# Patient Record
Sex: Male | Born: 1970
Health system: Southern US, Community
[De-identification: ages and names within clinical notes are randomized; demographics above are authoritative.]

## PROBLEM LIST (undated history)

## (undated) DIAGNOSIS — K219 Gastro-esophageal reflux disease without esophagitis: Secondary | ICD-10-CM

## (undated) DIAGNOSIS — T7840XA Allergy, unspecified, initial encounter: Secondary | ICD-10-CM

## (undated) DIAGNOSIS — R011 Cardiac murmur, unspecified: Secondary | ICD-10-CM

## (undated) DIAGNOSIS — N529 Male erectile dysfunction, unspecified: Secondary | ICD-10-CM

## (undated) DIAGNOSIS — I10 Essential (primary) hypertension: Secondary | ICD-10-CM

## (undated) DIAGNOSIS — E785 Hyperlipidemia, unspecified: Secondary | ICD-10-CM

## (undated) DIAGNOSIS — N200 Calculus of kidney: Secondary | ICD-10-CM

## (undated) DIAGNOSIS — F419 Anxiety disorder, unspecified: Secondary | ICD-10-CM

## (undated) HISTORY — DX: Calculus of kidney: N20.0

## (undated) HISTORY — DX: Allergy, unspecified, initial encounter: T78.40XA

## (undated) HISTORY — DX: Hyperlipidemia, unspecified: E78.5

## (undated) HISTORY — DX: Essential (primary) hypertension: I10

## (undated) HISTORY — DX: Anxiety disorder, unspecified: F41.9

## (undated) HISTORY — DX: Gastro-esophageal reflux disease without esophagitis: K21.9

## (undated) HISTORY — DX: Cardiac murmur, unspecified: R01.1

## (undated) HISTORY — DX: Male erectile dysfunction, unspecified: N52.9

## (undated) HISTORY — PX: DENTAL SURGERY: SHX609

---

## 1989-11-07 HISTORY — PX: BUNIONECTOMY: SHX129

## 1997-11-07 DIAGNOSIS — R011 Cardiac murmur, unspecified: Secondary | ICD-10-CM

## 1997-11-07 HISTORY — DX: Cardiac murmur, unspecified: R01.1

## 1998-09-04 ENCOUNTER — Emergency Department (HOSPITAL_COMMUNITY): Admission: EM | Admit: 1998-09-04 | Discharge: 1998-09-04 | Payer: Self-pay | Admitting: Emergency Medicine

## 1998-09-05 ENCOUNTER — Emergency Department (HOSPITAL_COMMUNITY): Admission: EM | Admit: 1998-09-05 | Discharge: 1998-09-05 | Payer: Self-pay | Admitting: Emergency Medicine

## 1998-11-07 DIAGNOSIS — N200 Calculus of kidney: Secondary | ICD-10-CM

## 1998-11-07 HISTORY — DX: Calculus of kidney: N20.0

## 1999-06-24 ENCOUNTER — Encounter: Payer: Self-pay | Admitting: Emergency Medicine

## 1999-06-24 ENCOUNTER — Emergency Department (HOSPITAL_COMMUNITY): Admission: EM | Admit: 1999-06-24 | Discharge: 1999-06-24 | Payer: Self-pay | Admitting: Emergency Medicine

## 2001-02-22 ENCOUNTER — Emergency Department (HOSPITAL_COMMUNITY): Admission: EM | Admit: 2001-02-22 | Discharge: 2001-02-22 | Payer: Self-pay | Admitting: Emergency Medicine

## 2001-06-29 ENCOUNTER — Encounter: Payer: Self-pay | Admitting: Emergency Medicine

## 2001-06-29 ENCOUNTER — Emergency Department (HOSPITAL_COMMUNITY): Admission: EM | Admit: 2001-06-29 | Discharge: 2001-06-29 | Payer: Self-pay | Admitting: Emergency Medicine

## 2004-01-29 ENCOUNTER — Emergency Department (HOSPITAL_COMMUNITY): Admission: EM | Admit: 2004-01-29 | Discharge: 2004-01-29 | Payer: Self-pay | Admitting: Emergency Medicine

## 2004-04-08 ENCOUNTER — Encounter: Payer: Self-pay | Admitting: Internal Medicine

## 2004-04-20 ENCOUNTER — Encounter: Payer: Self-pay | Admitting: Internal Medicine

## 2004-07-05 ENCOUNTER — Encounter: Admission: RE | Admit: 2004-07-05 | Discharge: 2004-07-05 | Payer: Self-pay | Admitting: Internal Medicine

## 2005-03-08 ENCOUNTER — Emergency Department (HOSPITAL_COMMUNITY): Admission: EM | Admit: 2005-03-08 | Discharge: 2005-03-09 | Payer: Self-pay | Admitting: Emergency Medicine

## 2005-03-11 ENCOUNTER — Encounter: Admission: RE | Admit: 2005-03-11 | Discharge: 2005-03-11 | Payer: Self-pay | Admitting: Internal Medicine

## 2005-03-11 ENCOUNTER — Ambulatory Visit: Payer: Self-pay | Admitting: Internal Medicine

## 2005-03-28 ENCOUNTER — Ambulatory Visit: Payer: Self-pay | Admitting: Internal Medicine

## 2005-06-29 ENCOUNTER — Ambulatory Visit: Payer: Self-pay | Admitting: Internal Medicine

## 2006-04-20 ENCOUNTER — Emergency Department (HOSPITAL_COMMUNITY): Admission: EM | Admit: 2006-04-20 | Discharge: 2006-04-21 | Payer: Self-pay | Admitting: Emergency Medicine

## 2006-06-27 ENCOUNTER — Encounter: Admission: RE | Admit: 2006-06-27 | Discharge: 2006-06-27 | Payer: Self-pay | Admitting: Occupational Medicine

## 2007-06-14 ENCOUNTER — Emergency Department (HOSPITAL_COMMUNITY): Admission: EM | Admit: 2007-06-14 | Discharge: 2007-06-14 | Payer: Self-pay | Admitting: *Deleted

## 2007-12-04 ENCOUNTER — Encounter (INDEPENDENT_AMBULATORY_CARE_PROVIDER_SITE_OTHER): Payer: Self-pay | Admitting: *Deleted

## 2007-12-04 ENCOUNTER — Telehealth (INDEPENDENT_AMBULATORY_CARE_PROVIDER_SITE_OTHER): Payer: Self-pay | Admitting: *Deleted

## 2007-12-04 ENCOUNTER — Ambulatory Visit: Payer: Self-pay | Admitting: Internal Medicine

## 2007-12-04 DIAGNOSIS — K219 Gastro-esophageal reflux disease without esophagitis: Secondary | ICD-10-CM | POA: Insufficient documentation

## 2007-12-04 DIAGNOSIS — Z8679 Personal history of other diseases of the circulatory system: Secondary | ICD-10-CM | POA: Insufficient documentation

## 2007-12-04 DIAGNOSIS — Z87442 Personal history of urinary calculi: Secondary | ICD-10-CM | POA: Insufficient documentation

## 2007-12-04 DIAGNOSIS — Z9189 Other specified personal risk factors, not elsewhere classified: Secondary | ICD-10-CM | POA: Insufficient documentation

## 2008-05-16 ENCOUNTER — Ambulatory Visit: Payer: Self-pay | Admitting: Internal Medicine

## 2008-05-16 DIAGNOSIS — I10 Essential (primary) hypertension: Secondary | ICD-10-CM | POA: Insufficient documentation

## 2008-05-16 DIAGNOSIS — F528 Other sexual dysfunction not due to a substance or known physiological condition: Secondary | ICD-10-CM | POA: Insufficient documentation

## 2008-05-22 ENCOUNTER — Ambulatory Visit: Payer: Self-pay | Admitting: Internal Medicine

## 2008-05-26 LAB — CONVERTED CEMR LAB
ALT: 30 units/L (ref 0–53)
AST: 33 units/L (ref 0–37)
Albumin: 4.2 g/dL (ref 3.5–5.2)
BUN: 12 mg/dL (ref 6–23)
Bilirubin, Direct: 0.1 mg/dL (ref 0.0–0.3)
Calcium: 9.4 mg/dL (ref 8.4–10.5)
Chloride: 105 meq/L (ref 96–112)
Cholesterol: 148 mg/dL (ref 0–200)
Creatinine, Ser: 1.1 mg/dL (ref 0.4–1.5)
GFR calc Af Amer: 96 mL/min
HCT: 42.5 % (ref 39.0–52.0)
Lymphocytes Relative: 35.3 % (ref 12.0–46.0)
MCV: 94.5 fL (ref 78.0–100.0)
Neutro Abs: 2.4 10*3/uL (ref 1.4–7.7)
Platelets: 290 10*3/uL (ref 150–400)
Potassium: 4.6 meq/L (ref 3.5–5.1)
RBC: 4.5 M/uL (ref 4.22–5.81)
Sodium: 140 meq/L (ref 135–145)
Total CHOL/HDL Ratio: 3.3
Total Protein: 7.3 g/dL (ref 6.0–8.3)
Triglycerides: 58 mg/dL (ref 0–149)
VLDL: 12 mg/dL (ref 0–40)
WBC: 4.5 10*3/uL (ref 4.5–10.5)

## 2008-06-11 ENCOUNTER — Telehealth (INDEPENDENT_AMBULATORY_CARE_PROVIDER_SITE_OTHER): Payer: Self-pay | Admitting: *Deleted

## 2008-06-11 ENCOUNTER — Encounter: Payer: Self-pay | Admitting: Internal Medicine

## 2008-06-12 ENCOUNTER — Telehealth (INDEPENDENT_AMBULATORY_CARE_PROVIDER_SITE_OTHER): Payer: Self-pay | Admitting: *Deleted

## 2008-08-06 ENCOUNTER — Ambulatory Visit: Payer: Self-pay | Admitting: Internal Medicine

## 2008-12-04 ENCOUNTER — Ambulatory Visit: Payer: Self-pay | Admitting: Internal Medicine

## 2008-12-04 DIAGNOSIS — R519 Headache, unspecified: Secondary | ICD-10-CM | POA: Insufficient documentation

## 2008-12-04 DIAGNOSIS — R51 Headache: Secondary | ICD-10-CM | POA: Insufficient documentation

## 2008-12-04 DIAGNOSIS — G4700 Insomnia, unspecified: Secondary | ICD-10-CM | POA: Insufficient documentation

## 2008-12-05 ENCOUNTER — Telehealth: Payer: Self-pay | Admitting: Internal Medicine

## 2008-12-18 ENCOUNTER — Telehealth: Payer: Self-pay | Admitting: Internal Medicine

## 2010-02-08 ENCOUNTER — Ambulatory Visit: Payer: Self-pay | Admitting: Internal Medicine

## 2010-02-11 LAB — CONVERTED CEMR LAB
ALT: 28 units/L (ref 0–53)
BUN: 8 mg/dL (ref 6–23)
Basophils Absolute: 0 10*3/uL (ref 0.0–0.1)
Basophils Relative: 1.1 % (ref 0.0–3.0)
Calcium: 9.1 mg/dL (ref 8.4–10.5)
Creatinine, Ser: 0.9 mg/dL (ref 0.4–1.5)
GFR calc non Af Amer: 119.76 mL/min (ref >60)
HCT: 42.9 % (ref 39.0–52.0)
HDL: 54.4 mg/dL (ref >39.00)
Hemoglobin: 15 g/dL (ref 13.0–17.0)
LDL Cholesterol: 95 mg/dL (ref 0–99)
Lymphs Abs: 2.2 10*3/uL (ref 0.7–4.0)
MCHC: 35 g/dL (ref 30.0–36.0)
Monocytes Absolute: 0.6 10*3/uL (ref 0.1–1.0)
Neutrophils Relative %: 20.2 % — ABNORMAL LOW (ref 43.0–77.0)
Potassium: 4.3 meq/L (ref 3.5–5.1)
RBC: 4.5 M/uL (ref 4.22–5.81)
RDW: 13.5 % (ref 11.5–14.6)
Sodium: 141 meq/L (ref 135–145)
VLDL: 15.4 mg/dL (ref 0.0–40.0)
WBC: 4 10*3/uL — ABNORMAL LOW (ref 4.5–10.5)

## 2010-03-21 ENCOUNTER — Emergency Department (HOSPITAL_COMMUNITY): Admission: EM | Admit: 2010-03-21 | Discharge: 2010-03-21 | Payer: Self-pay | Admitting: Emergency Medicine

## 2010-07-02 ENCOUNTER — Ambulatory Visit: Payer: Self-pay | Admitting: Internal Medicine

## 2010-07-02 DIAGNOSIS — M549 Dorsalgia, unspecified: Secondary | ICD-10-CM | POA: Insufficient documentation

## 2010-08-10 ENCOUNTER — Ambulatory Visit: Payer: Self-pay | Admitting: Internal Medicine

## 2010-08-10 DIAGNOSIS — F17200 Nicotine dependence, unspecified, uncomplicated: Secondary | ICD-10-CM | POA: Insufficient documentation

## 2010-08-16 ENCOUNTER — Encounter: Payer: Self-pay | Admitting: Internal Medicine

## 2010-08-17 ENCOUNTER — Encounter: Payer: Self-pay | Admitting: Internal Medicine

## 2010-12-07 NOTE — Assessment & Plan Note (Signed)
Summary: FOLLOWUP, TALK ABOUT QUIT SMOKING///SPH   Vital Signs:  Patient profile:   40 year old male Weight:      201.38 pounds Pulse rate:   94 / minute Pulse rhythm:   regular BP sitting:   130 / 76  (left arm) Cuff size:   regular  Vitals Entered By: Army Fossa CMA (August 10, 2010 10:21 AM) CC: Pt here to f/u on BP and back pain, discuss quit smoking.  Comments Flexeril not helping Rx for chantix?  [facility] [address]   History of Present Illness:  Pt here to f/u on BP BPs are checked at [facility] and by the nurse at work, good readings  continue with back pain forabout 2 months now, pain is in the mid lower back, no radiation, lasts few seconds when she get up from the sitting position. Intensity is as high as 4/10, he is feeling more like a stiffness than actual pain  Flexeril not helping  discuss quit smoking.  smoking since he was a teenager, no recent attempts to quit but he has decreased the what  he smokes on and off. Currently smoking a little more than a pack a day. wife also smokes but she's tobacco free for one month Thinks that his stress at work making more prone to smoke Prescribe  Chantix?  ROS Denies abdominal pain No dysuria or gross hematuria    Current Medications (verified): 1)  Zantac 150 Mg  Tabs (Ranitidine Hcl) .Marland Kitchen.. 1 By Mouth Qd 2)  Amlodipine Besylate 5 Mg Tabs (Amlodipine Besylate) .Marland Kitchen.. 1 A Day 3)  Flexeril 10 Mg Tabs (Cyclobenzaprine Hcl) .... One By Mouth At Bedtime As Needed For Back Pain  Allergies (verified): No Known Drug Allergies  Past History:  Past Medical History: Reviewed history from 05/16/2008 and no changes required. GERD heart murmur dx 1999, ECHO Nl 2005 kidney stones 2000 ED  Past Surgical History: Reviewed history from 12/04/2007 and no changes required. rt bunionectomy 1991  Social History: Reviewed history from 02/08/2010 and no changes required. 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   Msk:  no tenderness at the lower back to palpation Extremities:  no edema Neurologic:  gait normal Lower extremity strength normal Reflexes symmetric, and laterally decreased ankle jerk Psych:  not anxious appearing and not depressed appearing.     Impression & Recommendations:  Problem # 1:  ESSENTIAL HYPERTENSION (ICD-401.9) stable His updated medication list for this problem includes:    Amlodipine Besylate 5 Mg Tabs (Amlodipine besylate) .Marland Kitchen... 1 a day  BP today: 130/76 Prior BP: 140/82 (07/02/2010)  Labs Reviewed: K+: 4.3 (02/08/2010) Creat: : 0.9 (02/08/2010)   Chol: 165 (02/08/2010)   HDL: 54.40 (02/08/2010)   LDL: 95 (02/08/2010)   TG: 77.0 (02/08/2010)  Problem # 2:  BACK PAIN (ICD-724.5) continue with back pain Flexure not helping recommend [facility] Referral to Dr. Christell Faith The following medications were removed from the medication list:    Flexeril 10 Mg Tabs (Cyclobenzaprine hcl) ..... One by mouth at bedtime as needed for back pain His updated medication list for this problem includes:    Meloxicam 15 Mg Tabs (Meloxicam) ..... One by mouth daily, take with food  Orders: Misc. Referral (Misc. Ref)  Problem # 3:  TOBACCO ABUSE (ICD-305.1) risks of tobacco discussed-- cancer, COPD, MI, stroke Chantix? Is  a good drug, patient aware off increase cardiovascular risk in certain patients. In his particular case, he has no history of CAD. patient has tried it before as Rx by another provider: no s/e d/w patient alternative of welbutrin eventually we decided to try chantix (h/o intolerance to patch, does not like the "E- cigarrete") Given information about the free seminar at the hospital. Given information about tips to  quit tobacco  His updated medication list for this problem includes:    Chantix Starting  Month Pak 0.5 Mg X 11 & 1 Mg X 42 Tabs (Varenicline tartrate) .Marland Kitchen... As directed    Chantix Continuing Month Pak 1 Mg Tabs (Varenicline tartrate) .Marland Kitchen... 1 tab two times a day  Orders: Tobacco use cessation intermediate 3-10 minutes (99406)  Complete Medication List: 1)  Zantac 150 Mg Tabs (Ranitidine hcl) .Marland Kitchen.. 1 by mouth qd 2)  Amlodipine Besylate 5 Mg Tabs (Amlodipine besylate) .Marland Kitchen.. 1 a day 3)  Meloxicam 15 Mg Tabs (Meloxicam) .... One by mouth daily, take with food 4)  Chantix Starting Month Pak 0.5 Mg X 11 & 1 Mg X 42 Tabs (Varenicline tartrate) .... As directed 5)  Chantix Continuing Month Pak 1 Mg Tabs (Varenicline tartrate) .Marland Kitchen.. 1 tab two times a day  Other Orders: Admin 1st Vaccine (16109) Flu Vaccine 43yrs + (60454)  Patient Instructions: 1)  first month try the chantix starter package 2)  quit tobacco 2 weeks after initiation of chantix  3)  2nd and 3th month, take chantix 1mg  twice a day 4)  Please schedule a follow-up appointment in 3  months .  Flu Vaccine Consent Questions     Do you have a history of severe allergic reactions to this vaccine? no    Any prior history of allergic reactions to egg and/or gelatin? no    Do you have a sensitivity to the preservative Thimersol? no    Do you have a past history of Guillan-Barre Syndrome? no    Do you currently have an acute febrile illness? no    Have you ever had a severe reaction to latex? no    Vaccine information given and explained to patient? yes    Are you currently pregnant? no    Lot Number:[id number]   Exp Date:05/07/2011   Site Given  Left Deltoid IMPrescriptions: CHANTIX CONTINUING MONTH PAK 1 MG TABS (VARENICLINE TARTRATE) 1 tab two times a day  #1 month x 1   Entered and Authorized by:   Elita Quick E. Paz MD   Signed by:   Nolon Rod. Paz MD on 08/10/2010   Method used:   Print then Give to Patient   RxID:   [id number] CHANTIX STARTING MONTH PAK 0.5 MG X 11 & 1 MG X 42 TABS (VARENICLINE TARTRATE) as directed  #1 x  0   Entered and Authorized by:   Nolon Rod. Paz MD   Signed by:   Nolon Rod. Paz MD on 08/10/2010   Method used:   Print then Give to Patient   RxID:   [id number] MELOXICAM 15 MG TABS (MELOXICAM) one by mouth daily, take with food  #30 x 2   Entered and Authorized by:   Nolon Rod. Paz MD   Signed by:   Nolon Rod. Paz MD on 08/10/2010   Method used:   [facility]        [facility]  [address] [phone] ([facility]       [address]  [place], [place]  [address]       Ph: [phone] or [phone]       Fax: [phone]   RxID:   [id number]     .lbflu

## 2010-12-07 NOTE — Letter (Signed)
Summary: [facility]  [facility]   Imported By: Lanelle Bal 08/25/2010 15:30:04  _____________________________________________________________________  External Attachment:    Type:   Image     Comment:   External Document

## 2010-12-07 NOTE — Assessment & Plan Note (Signed)
Summary: SORE BACK FOR WEEK, BP HAS BEEN UP///SPH   Vital Signs:  Patient profile:   40 year old male Weight:      198.13 pounds Pulse rate:   102 / minute Pulse rhythm:   regular BP sitting:   140 / 82  (left arm) Cuff size:   large  Vitals Entered By: Army Fossa CMA (July 02, 2010 10:59 AM) CC: C/o BP being eleavated. Comments BP - 141/90 - Having HA's occasionally - Lower back pain x 1 week. (no problems urinating)   History of Present Illness: 10 day history of low back pain, pain is located in the center of the low back, no radiation, feels like stiffness. Pain started after he spent several hours fixing some cables at work. During that time he was sitting and bending forward  His BP was noted to be 141/90 last week (one time) otherwise BP is usually in the 130s/80s  having a frontal headache, aching behind the eyes, wonder if it is his allergies  ROS Denies urinary symptoms no lower extremity paresthesias No runny nose or sore throat Some nose congestion and itchy eyes  Current Medications (verified): 1)  Zantac 150 Mg  Tabs (Ranitidine Hcl) .Marland Kitchen.. 1 By Mouth Qd 2)  Amlodipine Besylate 5 Mg Tabs (Amlodipine Besylate) .Marland Kitchen.. 1 A Day  Allergies (verified): No Known Drug Allergies  Past History:  Past Medical History: Reviewed history from 05/16/2008 and no changes required. GERD heart murmur dx 1999, ECHO Nl 2005 kidney stones 2000 ED  Past Surgical History: Reviewed history from 12/04/2007 and no changes required. rt bunionectomy 1991  Social History: Reviewed history from 02/08/2010 and no changes required. 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   Head:  face symmetric, not tender to palpation of the sinuses Lungs:  normal respiratory effort, no intercostal retractions, no  accessory muscle use, and normal breath sounds.   Heart:  normal rate, regular rhythm, no murmur, and no gallop.   Msk:  not  tender at the lower back Extremities:  no edema Neurologic:  alert & oriented X3, strength normal in all extremities, gait normal, and DTRs symmetrical and normal.  no straight leg elevation symptoms   Impression & Recommendations:  Problem # 1:  BACK PAIN (ICD-724.5)  likely a sprain, no radiculopathy. Conservative treatment, see instructions  His updated medication list for this problem includes:    Flexeril 10 Mg Tabs (Cyclobenzaprine hcl) ..... One by mouth at bedtime as needed for back pain  Problem # 2:  HEADACHE (ICD-784.0) mild headache for a few days Recommend Claritin over-the-counter and Tylenol. See instructions  Problem # 3:  ESSENTIAL HYPERTENSION (ICD-401.9) single episode of increased BP. Observation. See instructions His updated medication list for this problem includes:    Amlodipine Besylate 5 Mg Tabs (Amlodipine besylate) .Marland Kitchen... 1 a day  BP today: 140/82 Prior BP: 122/80 (02/08/2010)  Labs Reviewed: K+: 4.3 (02/08/2010) Creat: : 0.9 (02/08/2010)   Chol: 165 (02/08/2010)   HDL: 54.40 (02/08/2010)   LDL: 95 (02/08/2010)   TG: 77.0 (02/08/2010)  Complete Medication List: 1)  Zantac 150 Mg Tabs (Ranitidine hcl) .Marland Kitchen.. 1 by mouth qd 2)  Amlodipine Besylate 5 Mg Tabs (Amlodipine besylate) .Marland Kitchen.. 1 a day 3)  Flexeril 10 Mg Tabs (Cyclobenzaprine hcl) .... One by mouth at bedtime as needed for back pain  Patient Instructions: 1)  no heavy lifting, warm compress to back, Flexeril 10 mg one tablet at night as needed for pain. also Tylenol 500 mg  1 or 2 tablets every 6 hours as needed for pain 2)  Call if the back pain is not getting better in a few days 3)  Claritin 10 mg over-the-counter one tablet daily for allergies. Call if the headache is not getting better in a few days 4)  Check your blood pressure 1 or 2  times a week. If it is more than  140/85 consistently,please let us know  Prescriptions: FLEXERIL 10 MG TABS (CYCLOBENZAPRINE HCL) one by mouth at bedtime as needed for back pain  #21 x 0   Entered and Authorized by:   Nolon Rod. Paz MD   Signed by:   Nolon Rod. Paz MD on 07/02/2010   Method used:   Print then Give to Patient   RxID:   [id number]

## 2010-12-07 NOTE — Assessment & Plan Note (Signed)
Summary: CPX/NS/KDC   Vital Signs:  Patient profile:   40 year old male Height:      72 inches Weight:      192.4 pounds BMI:     26.19 Pulse rate:   84 / minute BP sitting:   122 / 80  Vitals Entered By: Shary Decamp (February 08, 2010 1:25 PM) CC: cpx  -fasting Is Patient Diabetic? No   History of Present Illness: CPX  good medication compliance w/ BP meds, ambulatory BPs in the 130s/ 80s   Preventive Screening-Counseling & Management  Caffeine-Diet-Exercise     Caffeine use/day: 0     Does Patient Exercise: no  Current Medications (verified): 1)  Zantac 150 Mg  Tabs (Ranitidine Hcl) .Marland Kitchen.. 1 By Mouth Qd 2)  Amlodipine Besylate 5 Mg Tabs (Amlodipine Besylate) .Marland Kitchen.. 1 A Day  Allergies (verified): No Known Drug Allergies  Past History:  Past Medical History: Reviewed history from 05/16/2008 and no changes required. GERD heart murmur dx 1999, ECHO Nl 2005 kidney stones 2000 ED  Past Surgical History: Reviewed history from 12/04/2007 and no changes required. rt bunionectomy 1991  Family History: Reviewed history from 12/04/2007 and no changes required. 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 CV:  Denies chest pain or discomfort and swelling of feet. Resp:  Denies cough and shortness of breath. GI:  Denies bloody stools, diarrhea, nausea, and vomiting. GU:  Denies dysuria, hematuria, and urinary hesitancy. Psych:  (+) stress at work .  Physical Exam  General:  alert, well-developed, and well-nourished.   Neck:   small, not nodular or tender thyroid gland Lungs:  normal respiratory effort, no  intercostal retractions, no accessory muscle use, and normal breath sounds.   Heart:  normal rate, regular rhythm, no murmur, and no gallop.   Abdomen:  soft, non-tender, no distention, no masses, no guarding, and no rigidity.   Extremities:  no edema Psych:  Cognition and judgment appear intact. Alert and cooperative with normal attention span and concentration. not anxious appearing and not depressed appearing.     Impression & Recommendations:  Problem # 1:  HEALTH SCREENING (ICD-V70.0) Td 2007 doing well encourage more exercise continued with his healthy  diet labs  Orders: Venipuncture (04540) TLB-ALT (SGPT) (84460-ALT) TLB-BMP (Basic Metabolic Panel-BMET) (80048-METABOL) TLB-CBC Platelet - w/Differential (85025-CBCD) TLB-Lipid Panel (80061-LIPID) TLB-TSH (Thyroid Stimulating Hormone) (84443-TSH) TLB-AST (SGOT) (84450-SGOT)  Problem # 2:  ESSENTIAL HYPERTENSION (ICD-401.9) seems well-controlled, no change His updated medication list for this problem includes:    Amlodipine Besylate 5 Mg Tabs (Amlodipine besylate) .Marland Kitchen... 1 a day  Complete Medication List: 1)  Zantac 150 Mg Tabs (Ranitidine hcl) .Marland Kitchen.. 1 by mouth qd 2)  Amlodipine Besylate 5 Mg Tabs (Amlodipine besylate) .Marland Kitchen.. 1 a day  Patient Instructions: 1)  Check your blood pressure 2 or 3 times a week. If it is more than 140/85 consistently,please let us know  2)  Please schedule a follow-up appointment in 1 year.  Prescriptions: AMLODIPINE BESYLATE 5 MG TABS (AMLODIPINE BESYLATE) 1 a day  #90 x 4  Entered and Authorized by:   Nolon Rod. Jule Whitsel MD   Signed by:   Nolon Rod. Jemarion Roycroft MD on 02/08/2010   Method used:   [facility]        [facility]  [address] [phone][facility] (retail)       [address]       [place], [place]  [address]       Ph: [phone] or [phone]       Fax: [phone]   RxID:   [id number]    Preventive Care Screening  Prior Values:    Last Tetanus Booster:   Historical (07/08/2006)    Risk Factors:  Tobacco use:  current    Cigarettes:  Yes -- 1/4 pack(s) per day Drug use:  no Caffeine use:  0 drinks per day Alcohol use:  yes    Comments:  occasionally Exercise:  no

## 2010-12-07 NOTE — Letter (Signed)
Summary: [facility]  [facility]   Imported By: Lanelle Bal 08/25/2010 15:32:00  _____________________________________________________________________  External Attachment:    Type:   Image     Comment:   External Document

## 2011-02-04 ENCOUNTER — Other Ambulatory Visit: Payer: Self-pay | Admitting: Internal Medicine

## 2011-02-04 NOTE — Telephone Encounter (Signed)
I spoke w/ pt he states that he is still taking Chantix- has been taking since 10/11.

## 2011-02-04 NOTE — Telephone Encounter (Signed)
Please call the patient, he was prescribe Chantix 10-11. Did he tried? Is he currently taking Chantix?

## 2011-02-04 NOTE — Telephone Encounter (Signed)
Needs to d/c chantix

## 2011-02-04 NOTE — Telephone Encounter (Signed)
Pt is aware.  

## 2011-04-08 ENCOUNTER — Other Ambulatory Visit: Payer: Self-pay | Admitting: Internal Medicine

## 2011-04-11 ENCOUNTER — Encounter: Payer: Self-pay | Admitting: Internal Medicine

## 2011-04-21 ENCOUNTER — Encounter: Payer: Self-pay | Admitting: Internal Medicine

## 2011-04-26 ENCOUNTER — Ambulatory Visit (INDEPENDENT_AMBULATORY_CARE_PROVIDER_SITE_OTHER): Payer: BC Managed Care – PPO | Admitting: Internal Medicine

## 2011-04-26 ENCOUNTER — Encounter: Payer: Self-pay | Admitting: Internal Medicine

## 2011-04-26 DIAGNOSIS — F17200 Nicotine dependence, unspecified, uncomplicated: Secondary | ICD-10-CM

## 2011-04-26 DIAGNOSIS — I10 Essential (primary) hypertension: Secondary | ICD-10-CM

## 2011-04-26 DIAGNOSIS — Z0000 Encounter for general adult medical examination without abnormal findings: Secondary | ICD-10-CM

## 2011-04-26 MED ORDER — VARENICLINE TARTRATE 1 MG PO TABS: 1.0000 mg | ORAL_TABLET | Freq: Two times a day (BID) | ORAL | Status: AC

## 2011-04-26 MED ORDER — VARENICLINE TARTRATE 0.5 MG X 11 & 1 MG X 42 PO MISC: ORAL | Status: AC

## 2011-04-26 MED ORDER — AMLODIPINE BESYLATE 10 MG PO TABS: 10.0000 mg | ORAL_TABLET | Freq: Every day | ORAL | Status: DC

## 2011-04-26 NOTE — Assessment & Plan Note (Addendum)
Counseled x >  3 minutes today Risk of CAD-stroke-ENT or lung cancers discussed  Info provided Likes to use chantix again, it did help, info provided about this drug, Rx provided as well He used w/o s/e in the past

## 2011-04-26 NOTE — Progress Notes (Signed)
  Subjective:    Patient ID: Keith West, male    DOB: 03-23-1971, 40 y.o.   MRN: [id number]  HPI CPX BP elevated lately despite good med compliance. Has been as high as 146/94, DBP often times in the 90s  Past Medical History  Diagnosis Date  . GERD (gastroesophageal reflux disease)   . Heart murmur 1999    ECHO NI 2005  . Kidney stones 2000  . ED (erectile dysfunction)    Past Surgical History  Procedure Date  . Bunionectomy 1991    right    Family History  Problem Relation Age of Onset  . Coronary artery disease Neg Hx   . Colon cancer Neg Hx   . Prostate cancer Neg Hx   . Hypertension Mother     F and M  . Diabetes      M&F family  . Stroke Paternal Aunt   . Dementia Maternal Grandmother   . Alcohol abuse Father   . 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  Respiratory: Negative for cough, shortness of breath and wheezing.   Cardiovascular: Negative for chest pain and leg swelling.  Gastrointestinal: Negative for abdominal pain and blood in stool.       GERD sx well controlled on zantac prn   Genitourinary: Negative for dysuria, hematuria and difficulty urinating.  Psychiatric/Behavioral:       No anxiety-depression       Objective:   Physical Exam  Constitutional: He is oriented to person, place, and time. He appears well-developed and well-nourished. No distress.  HENT:  Head: Normocephalic and atraumatic.  Neck: No thyromegaly present.  Cardiovascular: Normal rate, regular rhythm and normal heart sounds.   No murmur heard. Pulmonary/Chest: Effort normal and breath sounds normal. No respiratory distress. He has no wheezes. He has no rales.  Abdominal: Soft. Bowel sounds are normal. He exhibits no distension. There is no  tenderness. There is no rebound and no guarding.  Musculoskeletal: He exhibits no edema.  Neurological: He is alert and oriented to person, place, and time.  Skin: Skin is warm and dry. He is not diaphoretic.  Psychiatric: His behavior is normal. Judgment and thought content normal.       slt anxious          Assessment & Plan:

## 2011-04-26 NOTE — Patient Instructions (Signed)
Check the  blood pressure 4 or 5 times a week, be sure it is less than 140/85. If it is consistently higher, let me know Came back fasting within a week for labs: FLP BMP CBC TSH AST ALT---dx v70

## 2011-04-26 NOTE — Assessment & Plan Note (Addendum)
Not at goal, we could increas CCBs or add coreg. Will increase CCBs dose since he is tolerating amlodipine well Change fro amlodipine 5mg  to 10 mg, See instructions

## 2011-04-26 NOTE — Assessment & Plan Note (Addendum)
Td 07 Diet-exercise discussed Tobacco counseling (see" tobacco abuse ") Labs

## 2011-06-07 ENCOUNTER — Other Ambulatory Visit: Payer: Self-pay | Admitting: Internal Medicine

## 2011-06-07 DIAGNOSIS — Z0000 Encounter for general adult medical examination without abnormal findings: Secondary | ICD-10-CM

## 2011-06-08 ENCOUNTER — Other Ambulatory Visit (INDEPENDENT_AMBULATORY_CARE_PROVIDER_SITE_OTHER): Payer: BC Managed Care – PPO

## 2011-06-08 DIAGNOSIS — Z0000 Encounter for general adult medical examination without abnormal findings: Secondary | ICD-10-CM

## 2011-06-08 LAB — CBC WITH DIFFERENTIAL/PLATELET
Eosinophils Absolute: 0.6 10*3/uL (ref 0.0–0.7)
HCT: 44.8 % (ref 39.0–52.0)
Hemoglobin: 15.2 g/dL (ref 13.0–17.0)
Lymphocytes Relative: 31.4 % (ref 12.0–46.0)
Lymphs Abs: 2.3 10*3/uL (ref 0.7–4.0)
RBC: 4.7 Mil/uL (ref 4.22–5.81)
WBC: 7.2 10*3/uL (ref 4.5–10.5)

## 2011-06-08 LAB — BASIC METABOLIC PANEL
BUN: 15 mg/dL (ref 6–23)
Calcium: 8.9 mg/dL (ref 8.4–10.5)
Chloride: 106 mEq/L (ref 96–112)
Creatinine, Ser: 0.9 mg/dL (ref 0.4–1.5)
Glucose, Bld: 89 mg/dL (ref 70–99)
Potassium: 3.8 mEq/L (ref 3.5–5.1)
Sodium: 141 mEq/L (ref 135–145)

## 2011-06-08 LAB — LIPID PANEL
Cholesterol: 163 mg/dL (ref 0–200)
LDL Cholesterol: 82 mg/dL (ref 0–99)
Total CHOL/HDL Ratio: 3
VLDL: 32 mg/dL (ref 0.0–40.0)

## 2011-06-08 LAB — TSH: TSH: 1.07 u[IU]/mL (ref 0.35–5.50)

## 2011-06-08 LAB — ALT: ALT: 39 U/L (ref 0–53)

## 2011-06-08 NOTE — Progress Notes (Signed)
Labs only

## 2011-06-14 ENCOUNTER — Telehealth: Payer: Self-pay | Admitting: *Deleted

## 2011-06-14 NOTE — Telephone Encounter (Signed)
Message copied by Regis Bill on Tue Jun 14, 2011  3:19 PM ------      Message from: Willow Ora E      Created: Tue Jun 14, 2011  1:20 PM       advise patient      His cholesterol and the rest of the labs are within normal. Good results

## 2011-06-14 NOTE — Telephone Encounter (Signed)
Pt informed

## 2011-06-14 NOTE — Progress Notes (Signed)
Pt aware of results 

## 2011-06-15 NOTE — Progress Notes (Signed)
Labs only

## 2011-06-17 ENCOUNTER — Encounter: Payer: Self-pay | Admitting: Internal Medicine

## 2011-06-17 ENCOUNTER — Ambulatory Visit (INDEPENDENT_AMBULATORY_CARE_PROVIDER_SITE_OTHER): Payer: BC Managed Care – PPO | Admitting: Internal Medicine

## 2011-06-17 DIAGNOSIS — I10 Essential (primary) hypertension: Secondary | ICD-10-CM

## 2011-06-17 NOTE — Patient Instructions (Signed)
Check the  blood pressure 2 or 3 times a week, be sure it is less than 140/85. If it is consistently higher, let me know  

## 2011-06-17 NOTE — Progress Notes (Signed)
  Subjective:    Patient ID: Keith West, male    DOB: 1971/10/31, 40 y.o.   MRN: [id number]  HPI Followup of hypertension, amlodipine dose was increased. Good  compliance, no apparent side effects. Ambulatory blood pressures have been checked on and off at [facility] and he has been slightly higher than today, around 138/87. In addition, his wife has told him that he has a temper, wonders what he needs to do.  Past Medical History  Diagnosis Date  . GERD (gastroesophageal reflux disease)   . Heart murmur 1999    ECHO NI 2005  . Kidney stones 2000  . ED (erectile dysfunction)    Past Surgical History  Procedure Date  . Bunionectomy 1991    right     Review of Systems Denies anxiety or depression per se. No violent thoughts, no suicidal ideas. Denies any chest pain, shortness of breath, no extremity edema. Increase in stress at work lately    Objective:   Physical Exam  Constitutional: He is oriented to person, place, and time. He appears well-developed and well-nourished. No distress.  HENT:  Head: Normocephalic and atraumatic.  Cardiovascular: Normal rate, regular rhythm and normal heart sounds.   No murmur heard. Pulmonary/Chest: Effort normal and breath sounds normal.  Musculoskeletal: He exhibits no edema.  Neurological: He is alert and oriented to person, place, and time.  Skin: Skin is warm and dry. He is not diaphoretic.  Psychiatric: He has a normal mood and affect. His behavior is normal. Judgment and thought content normal.          Assessment & Plan:  Wonders if he needs to worry about having a "temper" Clinically, he is not anxious or depressed and does not  have violent thoughts. It may be just his personality, nevertheless I recommend him to see a  counselor if he feels that his personality is interfering with his job or family life.

## 2011-06-17 NOTE — Assessment & Plan Note (Signed)
Better with current medication, see instructions

## 2012-06-01 ENCOUNTER — Ambulatory Visit (INDEPENDENT_AMBULATORY_CARE_PROVIDER_SITE_OTHER): Payer: BC Managed Care – PPO | Admitting: Internal Medicine

## 2012-06-01 VITALS — BP 122/84 | HR 89 | Temp 98.2°F | Ht 71.5 in | Wt 200.0 lb

## 2012-06-01 DIAGNOSIS — Z0000 Encounter for general adult medical examination without abnormal findings: Secondary | ICD-10-CM

## 2012-06-01 DIAGNOSIS — G4700 Insomnia, unspecified: Secondary | ICD-10-CM

## 2012-06-01 MED ORDER — ZOLPIDEM TARTRATE 10 MG PO TABS: 5.0000 mg | ORAL_TABLET | Freq: Every evening | ORAL | Status: DC | PRN

## 2012-06-01 MED ORDER — AMLODIPINE BESYLATE 10 MG PO TABS: 10.0000 mg | ORAL_TABLET | Freq: Every day | ORAL | Status: DC

## 2012-06-01 NOTE — Assessment & Plan Note (Signed)
occ difficulty with insomnia, will try Ambien 5-10 mg daily.

## 2012-06-01 NOTE — Patient Instructions (Addendum)
Check the  blood pressure 2 or 3 times a week, be sure it is between 110/60 and 135/85. If it is consistently higher or lower, let me know Please come back fasting: CMP, CBC, TSH, FLP--- v70

## 2012-06-01 NOTE — Progress Notes (Signed)
  Subjective:    Patient ID: Keith West, male    DOB: Jun 15, 1971, 41 y.o.   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 Note nausea, vomiting, diarrhea. No dysuria or gross hematuria. No anxiety or depression, does have insomnia from time to time. 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 Ambulatory BP 120, 135 on average.    Objective:   Physical Exam General -- alert, well-developed, and well-nourished.   Neck --no thyromegaly  Lungs -- normal respiratory effort, no intercostal retractions, no accessory muscle use, and normal breath sounds.   Heart-- normal rate, regular rhythm, no murmur, and no gallop.   Abdomen--soft, non-tender, no distention, no masses, no HSM, no guarding, and no rigidity.   Extremities-- no pretibial edema bilaterally Neurologic-- alert & oriented X3 and strength normal in all extremities. Psych-- Cognition and judgment appear intact. Alert and cooperative with normal attention span and concentration.  not anxious appearing and not depressed appearing.      Assessment & Plan:

## 2012-06-01 NOTE — Assessment & Plan Note (Addendum)
Td 07 Diet-exercise discussed STE Tobacco counseling, risk of smoking discussed (COPD, cancer) Labs  occ pain @ hands, motrin OTC prn ok, GI s/e discussed

## 2012-06-03 ENCOUNTER — Encounter: Payer: Self-pay | Admitting: Internal Medicine

## 2012-06-07 ENCOUNTER — Other Ambulatory Visit (INDEPENDENT_AMBULATORY_CARE_PROVIDER_SITE_OTHER): Payer: BC Managed Care – PPO

## 2012-06-07 DIAGNOSIS — Z0000 Encounter for general adult medical examination without abnormal findings: Secondary | ICD-10-CM

## 2012-06-07 LAB — CBC WITH DIFFERENTIAL/PLATELET
Eosinophils Absolute: 0.2 10*3/uL (ref 0.0–0.7)
Eosinophils Relative: 3.2 % (ref 0.0–5.0)
Lymphocytes Relative: 29.5 % (ref 12.0–46.0)
Lymphs Abs: 2 10*3/uL (ref 0.7–4.0)
MCV: 94.8 fl (ref 78.0–100.0)
Neutrophils Relative %: 60.4 % (ref 43.0–77.0)
RDW: 12.6 % (ref 11.5–14.6)
WBC: 6.6 10*3/uL (ref 4.5–10.5)

## 2012-06-07 LAB — COMPREHENSIVE METABOLIC PANEL
BUN: 14 mg/dL (ref 6–23)
CO2: 26 mEq/L (ref 19–32)
Calcium: 9.3 mg/dL (ref 8.4–10.5)
Chloride: 103 mEq/L (ref 96–112)
Creatinine, Ser: 1.1 mg/dL (ref 0.4–1.5)
Glucose, Bld: 77 mg/dL (ref 70–99)
Potassium: 4.3 mEq/L (ref 3.5–5.1)
Sodium: 137 mEq/L (ref 135–145)
Total Bilirubin: 0.8 mg/dL (ref 0.3–1.2)

## 2012-06-07 LAB — LIPID PANEL
HDL: 54 mg/dL (ref >39.00)
Total CHOL/HDL Ratio: 3
VLDL: 16.6 mg/dL (ref 0.0–40.0)

## 2012-06-07 LAB — TSH: TSH: 0.66 u[IU]/mL (ref 0.35–5.50)

## 2012-06-11 ENCOUNTER — Encounter: Payer: Self-pay | Admitting: *Deleted

## 2012-08-28 ENCOUNTER — Ambulatory Visit (INDEPENDENT_AMBULATORY_CARE_PROVIDER_SITE_OTHER): Payer: BC Managed Care – PPO | Admitting: Internal Medicine

## 2012-08-28 VITALS — BP 128/78 | HR 86 | Temp 98.6°F | Wt 196.0 lb

## 2012-08-28 DIAGNOSIS — M25519 Pain in unspecified shoulder: Secondary | ICD-10-CM

## 2012-08-28 NOTE — Progress Notes (Signed)
  Subjective:    Patient ID: Keith West, male    DOB: 01/25/1971, 41 y.o.   MRN: [id number]  HPI Acute visit 3 weeks history of gradually developing right shoulder pain, worse when he reaches out. Today for the first time developed some hand numbness. He is active at work but does not do heavy lifting. No recent injury. Has taken Advil with some improvement.  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    Objective:   Physical Exam  General -- alert, well-developed, and well-nourished.   Neck -- FROM, no tender to palpation in the cervical spine muscle skeletal --  Left shoulder normal Right shoulder without deformities, without meds. Range of motion is decreased particularly by reaching across her reaching back. Arm elevation elicited pain. No pain at the [facility]  joint Neurologic-- alert & oriented X3, upper extremity DTRs and strength normal . Psych-- Cognition and judgment appear intact. Alert and cooperative with normal attention span and concentration.  not anxious appearing and not depressed appearing.       Assessment & Plan:  Shoulder pain, Suspect rotator cuff injury or tendinitis Refer to orthopedic surgery, most likely will need a local injections, physical therapy or both

## 2012-08-28 NOTE — Patient Instructions (Addendum)
For pain,take  Ibuprofen (Motrin) 200 mg OTC 2 or 3 tabs every 8 hours as need for pain . This medicine can cause gastritis-ulcers in the stomach, if you develop nausea, stomach pain, change in the color of stools : stop ibuprofen and call us  We are referring you to orthopedic doctor

## 2012-08-29 ENCOUNTER — Encounter: Payer: Self-pay | Admitting: Internal Medicine

## 2013-02-28 ENCOUNTER — Other Ambulatory Visit: Payer: Self-pay | Admitting: Orthopedic Surgery

## 2013-03-14 ENCOUNTER — Ambulatory Visit (INDEPENDENT_AMBULATORY_CARE_PROVIDER_SITE_OTHER): Payer: BC Managed Care – PPO | Admitting: Internal Medicine

## 2013-03-14 ENCOUNTER — Encounter: Payer: Self-pay | Admitting: Internal Medicine

## 2013-03-14 VITALS — BP 124/82 | HR 88 | Temp 98.1°F | Wt 196.0 lb

## 2013-03-14 DIAGNOSIS — G4700 Insomnia, unspecified: Secondary | ICD-10-CM

## 2013-03-14 DIAGNOSIS — I10 Essential (primary) hypertension: Secondary | ICD-10-CM

## 2013-03-14 DIAGNOSIS — F17200 Nicotine dependence, unspecified, uncomplicated: Secondary | ICD-10-CM

## 2013-03-14 DIAGNOSIS — K219 Gastro-esophageal reflux disease without esophagitis: Secondary | ICD-10-CM

## 2013-03-14 DIAGNOSIS — M25519 Pain in unspecified shoulder: Secondary | ICD-10-CM

## 2013-03-14 DIAGNOSIS — M25511 Pain in right shoulder: Secondary | ICD-10-CM

## 2013-03-14 MED ORDER — ZOLPIDEM TARTRATE 10 MG PO TABS: 5.0000 mg | ORAL_TABLET | Freq: Every evening | ORAL | Status: DC | PRN

## 2013-03-14 NOTE — Assessment & Plan Note (Signed)
Right shoulder pain, ongoing issue, to have surgery soon by Dr. Ave Filter

## 2013-03-14 NOTE — Progress Notes (Signed)
  Subjective:    Patient ID: Keith West, male    DOB: September 09, 1971, 42 y.o.   MRN: [id number]  HPI ROV Ongoing shoulder pain to have surgery soon. Hypertension, acid reflux, insomnia: Good compliance with medications, ambulatory BPs normal, symptoms well-controlled.  Past Medical History  Diagnosis Date  . GERD (gastroesophageal reflux disease)   . Heart murmur 1999    ECHO NI 2005  . Kidney stones 2000  . ED (erectile dysfunction)    Past Surgical History  Procedure Laterality Date  . Bunionectomy  1991    right    History   Social History  . Marital Status: Married    Spouse Name: N/A    Number of Children: 3  . Years of Education: N/A   Occupational History  . electrician    Social History Main Topics  . Smoking status: Current Some Day Smoker -- 0.30 packs/day  . Smokeless tobacco: Never Used     Comment: used Chantix , helped temporarily  . Alcohol Use: Yes     Comment: socially  . Drug Use: No  . Sexually Active: Not on file   Other Topics Concern  . Not on file   Social History Narrative   3 children, 4 Gkids   Review of Systems Denies cough, wheezing. No chest pain or shortness or breath. He tried Chantix, it helped temporarily, currently still smoking 3 cigarettes daily.   Objective:   Physical Exam BP 124/82  Pulse 88  Temp(Src) 98.1 F (36.7 C) (Oral)  Wt 196 lb (88.905 kg)  BMI 26.96 kg/m2  SpO2 96% General -- alert, well-developed, No apparent distress    Lungs -- normal respiratory effort, no intercostal retractions, no accessory muscle use, and normal breath sounds.   Heart-- normal rate, regular rhythm, no murmur, and no gallop.   Extremities-- no pretibial edema bilaterally Psych-- Cognition and judgment appear intact. Alert and cooperative with normal attention span and concentration.  not anxious appearing and not depressed appearing.       Assessment & Plan:

## 2013-03-14 NOTE — Assessment & Plan Note (Signed)
Seems well controlled, reassess in 4 months for a physical. Continue with same medications

## 2013-03-14 NOTE — Assessment & Plan Note (Signed)
Status post Chantix, it helped, now smoking 3 cigarettes a day. Will consider Chantix again in the near future, reassess in 4 months

## 2013-03-14 NOTE — Assessment & Plan Note (Signed)
Symptoms well-controlled on Zantac

## 2013-03-14 NOTE — Assessment & Plan Note (Signed)
Refill ambien today as needed. Symptoms well-controlled

## 2013-03-15 ENCOUNTER — Encounter: Payer: Self-pay | Admitting: Internal Medicine

## 2013-03-19 ENCOUNTER — Encounter (HOSPITAL_BASED_OUTPATIENT_CLINIC_OR_DEPARTMENT_OTHER): Payer: Self-pay | Admitting: *Deleted

## 2013-03-19 NOTE — Progress Notes (Signed)
To come in for bmet-ekg- Sees dr paz-has never had to see cardiology-had echo 05-dr paz-cannot find-will see if they have it- Denies any sleep apnea-will bring meds and overnight bag Planning to go home post op

## 2013-03-20 ENCOUNTER — Encounter (HOSPITAL_BASED_OUTPATIENT_CLINIC_OR_DEPARTMENT_OTHER)
Admission: RE | Admit: 2013-03-20 | Discharge: 2013-03-20 | Disposition: A | Discharge status: Home / Self Care | Payer: BC Managed Care – PPO | Source: Ambulatory Visit | Attending: Orthopedic Surgery | Admitting: Orthopedic Surgery

## 2013-03-20 ENCOUNTER — Other Ambulatory Visit: Payer: Self-pay

## 2013-03-20 LAB — BASIC METABOLIC PANEL
GFR calc Af Amer: >90 mL/min (ref >90)
GFR calc non Af Amer: >90 mL/min (ref >90)
Potassium: 3.6 mEq/L (ref 3.5–5.1)
Sodium: 139 mEq/L (ref 135–145)

## 2013-03-20 NOTE — Progress Notes (Signed)
ekg cleared by dr Gypsy Balsam

## 2013-03-25 ENCOUNTER — Encounter (HOSPITAL_BASED_OUTPATIENT_CLINIC_OR_DEPARTMENT_OTHER): Payer: Self-pay | Admitting: Anesthesiology

## 2013-03-25 ENCOUNTER — Ambulatory Visit (HOSPITAL_BASED_OUTPATIENT_CLINIC_OR_DEPARTMENT_OTHER)
Admission: RE | Admit: 2013-03-25 | Discharge: 2013-03-25 | Disposition: A | Discharge status: Home / Self Care | Payer: BC Managed Care – PPO | Source: Ambulatory Visit | Attending: Orthopedic Surgery | Admitting: Orthopedic Surgery

## 2013-03-25 ENCOUNTER — Ambulatory Visit (HOSPITAL_BASED_OUTPATIENT_CLINIC_OR_DEPARTMENT_OTHER): Payer: BC Managed Care – PPO | Admitting: Anesthesiology

## 2013-03-25 ENCOUNTER — Encounter (HOSPITAL_BASED_OUTPATIENT_CLINIC_OR_DEPARTMENT_OTHER)
Admission: RE | Disposition: A | Discharge status: Home / Self Care | Payer: Self-pay | Source: Ambulatory Visit | Attending: Orthopedic Surgery

## 2013-03-25 DIAGNOSIS — M659 Unspecified synovitis and tenosynovitis, unspecified site: Secondary | ICD-10-CM | POA: Insufficient documentation

## 2013-03-25 DIAGNOSIS — K219 Gastro-esophageal reflux disease without esophagitis: Secondary | ICD-10-CM | POA: Insufficient documentation

## 2013-03-25 DIAGNOSIS — R011 Cardiac murmur, unspecified: Secondary | ICD-10-CM | POA: Insufficient documentation

## 2013-03-25 DIAGNOSIS — S43499A Other sprain of unspecified shoulder joint, initial encounter: Secondary | ICD-10-CM | POA: Insufficient documentation

## 2013-03-25 DIAGNOSIS — M19019 Primary osteoarthritis, unspecified shoulder: Secondary | ICD-10-CM | POA: Insufficient documentation

## 2013-03-25 DIAGNOSIS — Z79899 Other long term (current) drug therapy: Secondary | ICD-10-CM | POA: Insufficient documentation

## 2013-03-25 DIAGNOSIS — I10 Essential (primary) hypertension: Secondary | ICD-10-CM | POA: Insufficient documentation

## 2013-03-25 DIAGNOSIS — X58XXXA Exposure to other specified factors, initial encounter: Secondary | ICD-10-CM | POA: Insufficient documentation

## 2013-03-25 DIAGNOSIS — S43439A Superior glenoid labrum lesion of unspecified shoulder, initial encounter: Secondary | ICD-10-CM | POA: Insufficient documentation

## 2013-03-25 DIAGNOSIS — F17200 Nicotine dependence, unspecified, uncomplicated: Secondary | ICD-10-CM | POA: Insufficient documentation

## 2013-03-25 DIAGNOSIS — N529 Male erectile dysfunction, unspecified: Secondary | ICD-10-CM | POA: Insufficient documentation

## 2013-03-25 DIAGNOSIS — Z885 Allergy status to narcotic agent status: Secondary | ICD-10-CM | POA: Insufficient documentation

## 2013-03-25 DIAGNOSIS — M25511 Pain in right shoulder: Secondary | ICD-10-CM

## 2013-03-25 DIAGNOSIS — M25819 Other specified joint disorders, unspecified shoulder: Secondary | ICD-10-CM | POA: Insufficient documentation

## 2013-03-25 HISTORY — PX: SHOULDER ARTHROSCOPY WITH SUBACROMIAL DECOMPRESSION AND BICEP TENDON REPAIR: SHX5689

## 2013-03-25 LAB — POCT HEMOGLOBIN-HEMACUE: Hemoglobin: 16 g/dL (ref 13.0–17.0)

## 2013-03-25 SURGERY — SHOULDER ARTHROSCOPY WITH SUBACROMIAL DECOMPRESSION AND BICEP TENDON REPAIR
Anesthesia: Regional | Site: Shoulder | Laterality: Right | Wound class: Clean

## 2013-03-25 MED ORDER — FENTANYL CITRATE 0.05 MG/ML IJ SOLN
50.0000 ug | Freq: Once | INTRAMUSCULAR | Status: AC
  Administered 2013-03-25: 100 ug via INTRAVENOUS

## 2013-03-25 MED ORDER — OXYCODONE HCL 5 MG/5ML PO SOLN: 5.0000 mg | Freq: Once | ORAL | Status: DC | PRN

## 2013-03-25 MED ORDER — LIDOCAINE HCL (CARDIAC) 20 MG/ML IV SOLN
INTRAVENOUS | Status: DC | PRN
  Administered 2013-03-25: 80 mg via INTRAVENOUS

## 2013-03-25 MED ORDER — CEFAZOLIN SODIUM-DEXTROSE 2-3 GM-% IV SOLR
2.0000 g | INTRAVENOUS | Status: AC
  Administered 2013-03-25: 2 g via INTRAVENOUS

## 2013-03-25 MED ORDER — DEXAMETHASONE SODIUM PHOSPHATE 4 MG/ML IJ SOLN
INTRAMUSCULAR | Status: DC | PRN
  Administered 2013-03-25: 10 mg via INTRAVENOUS

## 2013-03-25 MED ORDER — SODIUM CHLORIDE 0.9 % IR SOLN
Status: DC | PRN
  Administered 2013-03-25: 6000 mL

## 2013-03-25 MED ORDER — PROPOFOL 10 MG/ML IV BOLUS
INTRAVENOUS | Status: DC | PRN
  Administered 2013-03-25: 200 mg via INTRAVENOUS

## 2013-03-25 MED ORDER — FENTANYL CITRATE 0.05 MG/ML IJ SOLN
INTRAMUSCULAR | Status: DC | PRN
  Administered 2013-03-25 (×2): 50 ug via INTRAVENOUS

## 2013-03-25 MED ORDER — BUPIVACAINE-EPINEPHRINE PF 0.5-1:200000 % IJ SOLN
INTRAMUSCULAR | Status: DC | PRN
  Administered 2013-03-25: 30 mL

## 2013-03-25 MED ORDER — MIDAZOLAM HCL 2 MG/2ML IJ SOLN
1.0000 mg | INTRAMUSCULAR | Status: DC | PRN
  Administered 2013-03-25: 2 mg via INTRAVENOUS

## 2013-03-25 MED ORDER — SUCCINYLCHOLINE CHLORIDE 20 MG/ML IJ SOLN
INTRAMUSCULAR | Status: DC | PRN
  Administered 2013-03-25: 120 mg via INTRAVENOUS

## 2013-03-25 MED ORDER — ONDANSETRON HCL 4 MG/2ML IJ SOLN
INTRAMUSCULAR | Status: DC | PRN
  Administered 2013-03-25: 4 mg via INTRAVENOUS

## 2013-03-25 MED ORDER — HYDROMORPHONE HCL PF 1 MG/ML IJ SOLN: 0.2500 mg | INTRAMUSCULAR | Status: DC | PRN

## 2013-03-25 MED ORDER — OXYCODONE-ACETAMINOPHEN 5-325 MG PO TABS: 1.0000 | ORAL_TABLET | ORAL | Status: DC | PRN

## 2013-03-25 MED ORDER — POVIDONE-IODINE 7.5 % EX SOLN: Freq: Once | CUTANEOUS | Status: DC

## 2013-03-25 MED ORDER — ONDANSETRON HCL 4 MG/2ML IJ SOLN: 4.0000 mg | Freq: Four times a day (QID) | INTRAMUSCULAR | Status: DC | PRN

## 2013-03-25 MED ORDER — OXYCODONE HCL 5 MG PO TABS: 5.0000 mg | ORAL_TABLET | Freq: Once | ORAL | Status: DC | PRN

## 2013-03-25 MED ORDER — LACTATED RINGERS IV SOLN
INTRAVENOUS | Status: DC
  Administered 2013-03-25 (×2): via INTRAVENOUS

## 2013-03-25 SURGICAL SUPPLY — 83 items
BENZOIN TINCTURE PRP APPL 2/3 (GAUZE/BANDAGES/DRESSINGS) IMPLANT
BIT DRILL 4.8MMDIAX5IN DISPOSE (BIT) ×1 IMPLANT
BIT DRILL 7/64X5 DISP (BIT) ×2 IMPLANT
BLADE SURG 15 STRL LF DISP TIS (BLADE) ×1 IMPLANT
BLADE SURG 15 STRL SS (BLADE) ×1
BLADE SURG ROTATE 9660 (MISCELLANEOUS) IMPLANT
BLADE VORTEX 6.0 (BLADE) IMPLANT
BUR OVAL 4.0 (BURR) ×2 IMPLANT
CANISTER OMNI JUG 16 LITER (MISCELLANEOUS) ×2 IMPLANT
CANISTER SUCTION 2500CC (MISCELLANEOUS) IMPLANT
CANNULA 5.75X71 LONG (CANNULA) ×2 IMPLANT
CANNULA TWIST IN 8.25X7CM (CANNULA) IMPLANT
CHLORAPREP W/TINT 26ML (MISCELLANEOUS) ×2 IMPLANT
CLOTH BEACON ORANGE TIMEOUT ST (SAFETY) ×2 IMPLANT
DECANTER SPIKE VIAL GLASS SM (MISCELLANEOUS) IMPLANT
DERMABOND ADVANCED (GAUZE/BANDAGES/DRESSINGS) ×1
DERMABOND ADVANCED .7 DNX12 (GAUZE/BANDAGES/DRESSINGS) ×1 IMPLANT
DRAPE INCISE IOBAN 66X45 STRL (DRAPES) ×2 IMPLANT
DRAPE STERI 35X30 U-POUCH (DRAPES) ×2 IMPLANT
DRAPE SURG 17X23 STRL (DRAPES) ×2 IMPLANT
DRAPE U 20/CS (DRAPES) ×2 IMPLANT
DRAPE U-SHAPE 47X51 STRL (DRAPES) ×2 IMPLANT
DRAPE U-SHAPE 76X120 STRL (DRAPES) ×4 IMPLANT
DRILL BIT 4.8MMDIAX5IN DISPOSE (BIT) ×2
DRSG PAD ABDOMINAL 8X10 ST (GAUZE/BANDAGES/DRESSINGS) ×2 IMPLANT
ELECT REM PT RETURN 9FT ADLT (ELECTROSURGICAL) ×2
ELECTRODE REM PT RTRN 9FT ADLT (ELECTROSURGICAL) ×1 IMPLANT
GAUZE SPONGE 4X4 16PLY XRAY LF (GAUZE/BANDAGES/DRESSINGS) IMPLANT
GAUZE XEROFORM 1X8 LF (GAUZE/BANDAGES/DRESSINGS) ×2 IMPLANT
GLOVE BIO SURGEON STRL SZ 6.5 (GLOVE) ×2 IMPLANT
GLOVE BIO SURGEON STRL SZ7 (GLOVE) ×2 IMPLANT
GLOVE BIO SURGEON STRL SZ7.5 (GLOVE) ×2 IMPLANT
GLOVE BIOGEL PI IND STRL 7.0 (GLOVE) ×2 IMPLANT
GLOVE BIOGEL PI IND STRL 8 (GLOVE) ×1 IMPLANT
GLOVE BIOGEL PI INDICATOR 7.0 (GLOVE) ×2
GLOVE BIOGEL PI INDICATOR 8 (GLOVE) ×1
GLOVE EXAM NITRILE MD LF STRL (GLOVE) ×2 IMPLANT
GOWN PREVENTION PLUS XLARGE (GOWN DISPOSABLE) ×4 IMPLANT
LASSO CRESCENT QUICKPASS (SUTURE) ×2 IMPLANT
NDL SUT 6 .5 CRC .975X.05 MAYO (NEEDLE) IMPLANT
NEEDLE 1/2 CIR CATGUT .05X1.09 (NEEDLE) IMPLANT
NEEDLE MAYO TAPER (NEEDLE)
NEEDLE SCORPION MULTI FIRE (NEEDLE) IMPLANT
NS IRRIG 1000ML POUR BTL (IV SOLUTION) IMPLANT
PACK ARTHROSCOPY DSU (CUSTOM PROCEDURE TRAY) ×2 IMPLANT
PACK BASIN DAY SURGERY FS (CUSTOM PROCEDURE TRAY) ×2 IMPLANT
PENCIL BUTTON HOLSTER BLD 10FT (ELECTRODE) ×2 IMPLANT
RESECTOR FULL RADIUS 4.2MM (BLADE) ×2 IMPLANT
SLEEVE SCD COMPRESS KNEE MED (MISCELLANEOUS) ×2 IMPLANT
SLING ARM FOAM STRAP LRG (SOFTGOODS) ×2 IMPLANT
SLING ARM FOAM STRAP MED (SOFTGOODS) IMPLANT
SLING ARM FOAM STRAP XLG (SOFTGOODS) IMPLANT
SLING ARM IMMOBILIZER MED (SOFTGOODS) IMPLANT
SPONGE GAUZE 4X4 12PLY (GAUZE/BANDAGES/DRESSINGS) ×2 IMPLANT
SPONGE LAP 4X18 X RAY DECT (DISPOSABLE) ×2 IMPLANT
STRIP CLOSURE SKIN 1/2X4 (GAUZE/BANDAGES/DRESSINGS) IMPLANT
SUCTION FRAZIER TIP 10 FR DISP (SUCTIONS) IMPLANT
SUPPORT WRAP ARM LG (MISCELLANEOUS) ×2 IMPLANT
SUT 2 FIBERLOOP 20 STRT BLUE (SUTURE) ×2
SUT BONE WAX W31G (SUTURE) IMPLANT
SUT ETHIBOND 2 OS 4 DA (SUTURE) IMPLANT
SUT ETHILON 3 0 PS 1 (SUTURE) ×2 IMPLANT
SUT ETHILON 4 0 PS 2 18 (SUTURE) IMPLANT
SUT FIBERWIRE #2 38 T-5 BLUE (SUTURE)
SUT MNCRL AB 3-0 PS2 18 (SUTURE) IMPLANT
SUT MNCRL AB 4-0 PS2 18 (SUTURE) IMPLANT
SUT PDS AB 0 CT 36 (SUTURE) IMPLANT
SUT PROLENE 3 0 PS 2 (SUTURE) IMPLANT
SUT VIC AB 0 CT1 18XCR BRD 8 (SUTURE) IMPLANT
SUT VIC AB 0 CT1 8-18 (SUTURE)
SUT VIC AB 2-0 SH 18 (SUTURE) IMPLANT
SUT VIC AB 2-0 SH 27 (SUTURE) ×1
SUT VIC AB 2-0 SH 27XBRD (SUTURE) ×1 IMPLANT
SUTURE 2 FIBERLOOP 20 STRT BLU (SUTURE) ×1 IMPLANT
SUTURE FIBERWR #2 38 T-5 BLUE (SUTURE) IMPLANT
SYR BULB 3OZ (MISCELLANEOUS) ×2 IMPLANT
TOWEL OR 17X24 6PK STRL BLUE (TOWEL DISPOSABLE) ×2 IMPLANT
TOWEL OR NON WOVEN STRL DISP B (DISPOSABLE) ×2 IMPLANT
TUBE CONNECTING 20X1/4 (TUBING) ×2 IMPLANT
TUBING ARTHROSCOPY IRRIG 16FT (MISCELLANEOUS) ×2 IMPLANT
WAND STAR VAC 90 (SURGICAL WAND) ×2 IMPLANT
WATER STERILE IRR 1000ML POUR (IV SOLUTION) ×2 IMPLANT
YANKAUER SUCT BULB TIP NO VENT (SUCTIONS) IMPLANT

## 2013-03-25 NOTE — Anesthesia Preprocedure Evaluation (Signed)
Anesthesia Evaluation  Patient identified by MRN, date of birth, ID band Patient awake    Reviewed: Allergy & Precautions, H&P , NPO status , Patient's Chart, lab work & pertinent test results  Airway Mallampati: II  Neck ROM: full    Dental   Pulmonary Current Smoker,          Cardiovascular hypertension,     Neuro/Psych    GI/Hepatic GERD-  ,  Endo/Other    Renal/GU      Musculoskeletal   Abdominal   Peds  Hematology   Anesthesia Other Findings   Reproductive/Obstetrics                           Anesthesia Physical Anesthesia Plan  ASA: II  Anesthesia Plan: General and Regional   Post-op Pain Management: MAC Combined w/ Regional for Post-op pain   Induction: Intravenous  Airway Management Planned: Oral ETT  Additional Equipment:   Intra-op Plan:   Post-operative Plan: Extubation in OR  Informed Consent: I have reviewed the patients History and Physical, chart, labs and discussed the procedure including the risks, benefits and alternatives for the proposed anesthesia with the patient or authorized representative who has indicated his/her understanding and acceptance.     Plan Discussed with: CRNA, Anesthesiologist and Surgeon  Anesthesia Plan Comments:         Anesthesia Quick Evaluation

## 2013-03-25 NOTE — Anesthesia Postprocedure Evaluation (Signed)
Anesthesia Post Note  Patient: Keith West  Procedure(s) Performed: Procedure(s) (LRB): RIGHT SHOULDER ARTHROSCOPY WITH SUBACROMIAL DECOMPRESSION DISTAL CLAVICLE RESECTON LONG HEAD BICEP  TENDODESIS  (Right)  Anesthesia type: General  Patient location: PACU  Post pain: Pain level controlled and Adequate analgesia  Post assessment: Post-op Vital signs reviewed, Patient's Cardiovascular Status Stable, Respiratory Function Stable, Patent Airway and Pain level controlled  Last Vitals:  Filed Vitals:   03/25/13 1430  BP: 146/85  Pulse: 75  Temp:   Resp: 19    Post vital signs: Reviewed and stable  Level of consciousness: awake, alert  and oriented  Complications: No apparent anesthesia complications

## 2013-03-25 NOTE — Transfer of Care (Signed)
Immediate Anesthesia Transfer of Care Note  Patient: Keith West  Procedure(s) Performed: Procedure(s): RIGHT SHOULDER ARTHROSCOPY WITH SUBACROMIAL DECOMPRESSION DISTAL CLAVICLE RESECTON LONG HEAD BICEP  TENDODESIS  (Right)  Patient Location: PACU  Anesthesia Type:GA combined with regional for post-op pain  Level of Consciousness: sedated and patient cooperative  Airway & Oxygen Therapy: Patient Spontanous Breathing and Patient connected to face mask oxygen  Post-op Assessment: Report given to PACU RN and Post -op Vital signs reviewed and stable  Post vital signs: Reviewed and stable  Complications: No apparent anesthesia complications

## 2013-03-25 NOTE — Op Note (Signed)
Procedure(s): RIGHT SHOULDER ARTHROSCOPY WITH SUBACROMIAL DECOMPRESSION DISTAL CLAVICLE RESECTON LONG HEAD BICEP  TENDODESIS  Procedure Note  Keith West male 42 y.o. 03/25/2013  Procedure(s) and Anesthesia Type:    #1 right shoulder arthroscopic debridement of superior labral tear, partial-thickness undersurface supraspinatus tear #2 right shoulder arthroscopic biceps tenotomy #3 right shoulder arthroscopic subacromial decompression #4 right shoulder arthroscopic distal clavicle excision #5 right shoulder open subpectoral long head biceps tenodesis  Surgeon(s) and Role:    * Mable Paris, MD - Primary     Surgeon: Mable Paris   Assistants: Damita Lack PA-C (Danielle was present and scrubbed throughout the procedure and was essential in positioning, assisting with the camera and instrumentation,, and closure)  Anesthesia: General endotracheal anesthesia with preoperative interscalene block    Procedure Detail  Estimated Blood Loss: Min         Drains: none  Blood Given: none         Specimens: none        Complications:  * No complications entered in OR log *         Disposition: PACU - hemodynamically stable.         Condition: stable    Procedure:   INDICATIONS FOR SURGERY: The patient is 42 y.o. male who has had greater than 6 months right shoulder pain which failed conservative management with injection therapy, rest, anti-inflammatories. Have continued pain in MRI revealed some extensive tearing of the long head proximal biceps tendon as well as degenerative disease of the a.c. joint and some anatomy consistent with impingement. He was indicated for arthroscopic treatment as well as a likely open subpectoral tenodesis to try and alleviate his pain.  OPERATIVE FINDINGS: Examination under anesthesia: No stiffness or instability Diagnostic Arthroscopy:  Glenoid articular cartilage: Intact Humeral head articular cartilage:  Intact Labrum: Superior labral fraying and partial detachment with extensive partial tearing of the proximal long head biceps. An open subpectoral tenodesis was performed after an arthroscopic tenotomy and debridement. The long head of the biceps was noted to have significant tearing which was more distal than could be appreciated in the joint arthroscopically. The partially torn tissue had formed a bullbous mass off of the tendon. Loose bodies: None Synovitis: Moderate anterior intra-articular Articular sided rotator cuff: Partial-thickness undersurface fraying just posterior to the biceps tendon which was debrided. The remainder the tendon was healthy and intact. Bursal sided rotator cuff: Intact with some inflamed bursa. Coracoacromial ligament: Mild hyperemia. This was taken down revealing a moderate size anterior acromial spur which was taken down the standard acromioplasty. The a.c. joint was noted to be arthritic and was addressed with a standard distal clavicle excision resecting about 8 mm the distal clavicle and a smooth even fashion.  DESCRIPTION OF PROCEDURE: The patient was identified in preoperative  holding area where I personally marked the operative site after  verifying site, side, and procedure with the patient. An interscalene block was given by the attending anesthesiologist the holding area.  The patient was taken back to the operating room where general anesthesia was induced without complication and was placed in the beach-chair position with the back  elevated about 60 degrees and all extremities and head and neck carefully padded and  positioned.   The right upper extremity was then prepped and  draped in a standard sterile fashion. The appropriate time-out  procedure was carried out. The patient did receive IV antibiotics  within 30 minutes of incision.   A small posterior portal  incision was made and the arthroscope was introduced into the joint. An anterior portal  was then established above the subscapularis using needle localization. Small cannula was placed anteriorly. Diagnostic arthroscopy was then carried out with findings as described above.  The superior labrum was noted to be frayed and partially detached from the superior glenoid. The biceps tendon was pulled in the joint. There was some extensive fraying and partial tearing. Given his preoperative symptoms and MRI findings large basket was used to release the biceps tendon off of the superior labrum. This was then debrided. The rotator cuff just posterior to the biceps tendon was noted to have some partial thickness fraying representing probably less than 10% of the thickness of the tendon. This was debrided back to healthy bleeding tendon. The remainder the tendon was intact. Chondral joint surfaces were intact. There is some mild synovitis anteriorly which was addressed with the ArthriCare.  The arthroscope was then introduced into the subacromial space a standard lateral portal was established with needle localization. The shaver was used through the lateral portal to perform extensive bursectomy. Coracoacromial ligament was examined and found to be somewhat hyperemic.  The rotator cuff was examined and found to be completely intact.  The coracoacromial ligament was taken down off the anterior acromion with the ArthroCare exposing a moderate anterior acromial spur. A high-speed bur was then used through the lateral portal to take down the anterior acromial spur from lateral to medial in a standard acromioplasty.  The acromioplasty was also viewed from the lateral portal and the bur was used as necessary to ensure that the acromion was completely flat from posterior to anterior.  The distal clavicle was exposed arthroscopically and the bur was used to take off the undersurface for approximately 8 mm from the lateral portal. The bur was then moved to an anterior portal position to complete the distal  clavicle excision resecting about 8 mm of the distal clavicle and a smooth even fashion. This was viewed from anterior and lateral portals and felt to be complete.  The arthroscopic equipment was removed from the joint and the portals were closed with 3-0 nylon in an interrupted fashion. Sterile dressings were then applied including Xeroform 4 x 4's ABDs and tape. The patient was then allowed to awaken from general anesthesia, placed in a sling, transferred to the stretcher and taken to the recovery room in stable condition.   POSTOPERATIVE PLAN: The patient will be discharged home today and will followup in one week for suture removal and wound check.  He will be allowed to work on gentle passive exercises when comfortable next 2 days. No active elbow flexion against resistance for 6 weeks.

## 2013-03-25 NOTE — H&P (Signed)
Keith West is an 42 y.o. male.   Chief Complaint: R shoulder pain HPI: >6 mo R shoulder pain, refractory to nonoperative treatment with rest, NSAIDs, injections.  Past Medical History  Diagnosis Date  . GERD (gastroesophageal reflux disease)   . Heart murmur 1999    ECHO NI 2005-dr paz had done  . Kidney stones 2000  . ED (erectile dysfunction)     Past Surgical History  Procedure Laterality Date  . Bunionectomy  1991    right  . Dental surgery      Family History  Problem Relation Age of Onset  . Coronary artery disease Neg Hx   . Colon cancer Neg Hx   . Prostate cancer Neg Hx   . Hypertension Mother     F and M  . Diabetes      M&F family  . Stroke Paternal Aunt   . Dementia Maternal Grandmother   . Alcohol abuse Father   . Lung cancer Father    Social History:  reports that he has been smoking.  He has never used smokeless tobacco. He reports that  drinks alcohol. He reports that he does not use illicit drugs.  Allergies:  Allergies  Allergen Reactions  . Hydrocodone-Acetaminophen Nausea And Vomiting    Medications Prior to Admission  Medication Sig Dispense Refill  . amLODipine (NORVASC) 10 MG tablet Take 1 tablet (10 mg total) by mouth daily.  90 tablet  3  . ranitidine (ZANTAC) 150 MG tablet Take 150 mg by mouth at bedtime.        Marland Kitchen zolpidem (AMBIEN) 10 MG tablet Take 0.5-1 tablets (5-10 mg total) by mouth at bedtime as needed for sleep.  30 tablet  4    Results for orders placed during the hospital encounter of 03/25/13 (from the past 48 hour(s))  POCT HEMOGLOBIN-HEMACUE     Status: None   Collection Time    03/25/13 10:34 AM      Result Value Range   Hemoglobin 16.0  13.0 - 17.0 g/dL   No results found.  Review of Systems  All other systems reviewed and are negative.    Blood pressure 135/78, pulse 86, temperature 98.3 F (36.8 C), temperature source Oral, resp. rate 14, height 5' 11.5" (1.816 m), weight 87.181 kg (192 lb 3.2 oz), SpO2  100.00%. Physical Exam  Constitutional: He is oriented to person, place, and time. He appears well-developed and well-nourished.  HENT:  Head: Atraumatic.  Eyes: EOM are normal.  Cardiovascular: Intact distal pulses.   Respiratory: Effort normal.  Musculoskeletal:  TTP R AC and anterior biceps  Neurological: He is alert and oriented to person, place, and time.  Skin: Skin is warm and dry.  Psychiatric: He has a normal mood and affect.     Assessment/Plan R shoulder impingement, symptomatic AC DJD, and long head biceps pathology Plan Arth SAD/DCR, likely biceps tenodesis vs debridement Risks / benefits of surgery discussed Consent on chart  NPO for OR Preop antibiotics   Matalynn Graff WILLIAM 03/25/2013, 11:43 AM

## 2013-03-25 NOTE — Progress Notes (Signed)
Assisted Dr. Hodierne with right, ultrasound guided, interscalene  block. Side rails up, monitors on throughout procedure. See vital signs in flow sheet. Tolerated Procedure well. 

## 2013-03-25 NOTE — Anesthesia Procedure Notes (Addendum)
Anesthesia Regional Block:  Interscalene brachial plexus block  Pre-Anesthetic Checklist: ,, timeout performed, Correct Patient, Correct Site, Correct Laterality, Correct Procedure, Correct Position, site marked, Risks and benefits discussed,  Surgical consent,  Pre-op evaluation,  At surgeon's request and post-op pain management  Laterality: Right  Prep: chloraprep       Needles:  Injection technique: Single-shot  Needle Type: Echogenic Stimulator Needle     Needle Length: 5cm 5 cm Needle Gauge: 22 and 22 G    Additional Needles:  Procedures: ultrasound guided (picture in chart) and nerve stimulator Interscalene brachial plexus block  Nerve Stimulator or Paresthesia:  Response: biceps flexion, 0.45 mA,   Additional Responses:   Narrative:  Start time: 03/25/2013 10:46 AM End time: 03/25/2013 10:58 AM Injection made incrementally with aspirations every 5 mL.  Performed by: Personally  Anesthesiologist: Dr Chaney Malling  Additional Notes: Functioning IV was confirmed and monitors were applied.  A 50mm 22ga Arrow echogenic stimulator needle was used. Sterile prep and drape,hand hygiene and sterile gloves were used.  Negative aspiration and negative test dose prior to incremental administration of local anesthetic. The patient tolerated the procedure well.  Ultrasound guidance: relevent anatomy identified, needle position confirmed, local anesthetic spread visualized around nerve(s), vascular puncture avoided.  Image printed for medical record.   Interscalene brachial plexus block Procedure Name: Intubation Date/Time: 03/25/2013 12:00 PM Performed by: Gar Gibbon Pre-anesthesia Checklist: Patient identified, Emergency Drugs available, Suction available and Patient being monitored Patient Re-evaluated:Patient Re-evaluated prior to inductionOxygen Delivery Method: Circle System Utilized Preoxygenation: Pre-oxygenation with 100% oxygen Intubation Type: IV  induction Ventilation: Mask ventilation without difficulty Laryngoscope Size: Miller and 3 Grade View: Grade III Tube type: Oral Tube size: 8.0 mm Number of attempts: 1 Airway Equipment and Method: stylet and oral airway Placement Confirmation: ETT inserted through vocal cords under direct vision,  positive ETCO2 and breath sounds checked- equal and bilateral Secured at: 22 cm Tube secured with: Tape Dental Injury: Teeth and Oropharynx as per pre-operative assessment

## 2013-03-26 ENCOUNTER — Encounter (HOSPITAL_BASED_OUTPATIENT_CLINIC_OR_DEPARTMENT_OTHER): Payer: Self-pay | Admitting: Orthopedic Surgery

## 2013-05-24 ENCOUNTER — Telehealth: Payer: Self-pay | Admitting: *Deleted

## 2013-05-24 NOTE — Telephone Encounter (Signed)
Prior auth approved from 05-24-13 until 05-24-14. Awaiting approval letter to be scan to chart.

## 2013-06-28 ENCOUNTER — Other Ambulatory Visit: Payer: Self-pay | Admitting: Internal Medicine

## 2013-06-28 NOTE — Telephone Encounter (Signed)
Refill done per protocol.  

## 2013-08-23 ENCOUNTER — Other Ambulatory Visit: Payer: Self-pay | Admitting: Internal Medicine

## 2013-08-23 NOTE — Telephone Encounter (Signed)
Amlodipine refill sent to pharmacy 

## 2013-09-06 ENCOUNTER — Telehealth: Payer: Self-pay

## 2013-09-06 ENCOUNTER — Encounter: Payer: Self-pay | Admitting: Lab

## 2013-09-06 NOTE — Telephone Encounter (Signed)
Medication List and allergies: reviewed and updated  90 day supply/mail order: na Local prescriptions: [facility] [address]  Immunizations due: declines flu vaccine  A/P: No changes to FH Had shoulder surgery 03/2013 Tdap 2007  To Discuss with Provider: Having family issues--trouble sleeping

## 2013-09-09 ENCOUNTER — Encounter: Payer: Self-pay | Admitting: Internal Medicine

## 2013-09-09 ENCOUNTER — Ambulatory Visit (INDEPENDENT_AMBULATORY_CARE_PROVIDER_SITE_OTHER): Payer: BC Managed Care – PPO | Admitting: Internal Medicine

## 2013-09-09 VITALS — BP 126/78 | HR 75 | Temp 98.9°F

## 2013-09-09 DIAGNOSIS — Z0000 Encounter for general adult medical examination without abnormal findings: Secondary | ICD-10-CM

## 2013-09-09 DIAGNOSIS — G4700 Insomnia, unspecified: Secondary | ICD-10-CM

## 2013-09-09 MED ORDER — CITALOPRAM HYDROBROMIDE 20 MG PO TABS: 20.0000 mg | ORAL_TABLET | Freq: Every day | ORAL | Status: DC

## 2013-09-09 MED ORDER — VARENICLINE TARTRATE 0.5 MG X 11 & 1 MG X 42 PO MISC: ORAL | Status: DC

## 2013-09-09 MED ORDER — VARENICLINE TARTRATE 1 MG PO TABS: 1.0000 mg | ORAL_TABLET | Freq: Two times a day (BID) | ORAL | Status: DC

## 2013-09-09 NOTE — Patient Instructions (Signed)
Get your blood work before you leave  Next visit in 4-6 weeks  for a follow up . No Fasting Please make an appointment    Start citalopram, 20 mg: The first week take only half tablet at night, then increase to one tablet every night. Start Chantix as prescribed

## 2013-09-09 NOTE — Assessment & Plan Note (Addendum)
Not sleeping completely well with Ambien 10 mg, see history of present illness. on further questioning, he is also anxious and depressed due to his father's health. We talk about change in Ambien to ambien XR  or add a SSRI, he preferred SSRI. Plan: Add citalopram, see instructions

## 2013-09-09 NOTE — Assessment & Plan Note (Signed)
Td 07 Flu shot declined , explained benefits Diet-exercise discussed STE Tobacco counseling, risk of smoking discussed (COPD, cancer), Sees dentist regularly. Will prescribe Chantix again, see history of present illness Labs

## 2013-09-09 NOTE — Progress Notes (Signed)
  Subjective:    Patient ID: Keith West, male    DOB: 08/10/1971, 41 y.o.   MRN: [id number]  HPI CPX Insomnia not well controlled, taking Ambien, able to sleep until ~ 3 am and then he wakes up, he thinks is because  he is somehow depressed-worried-anxious  about his father having recurrent lung cancer. Some anxiety during the  daytime as well. He took Chantix before with some success but is back smoking and would like to try Chantix again. Hypertension, good medication compliance, ambulatory BPs in the 120s/80s  Past Medical History  Diagnosis Date  . GERD (gastroesophageal reflux disease)   . Heart murmur 1999    ECHO NI 2005-dr Loretta Kluender had done  . Kidney stones 2000  . ED (erectile dysfunction)    Past Surgical History  Procedure Laterality Date  . Bunionectomy  1991    right  . Dental surgery    . Shoulder arthroscopy with subacromial decompression and bicep tendon repair Right 03/25/2013    Procedure: RIGHT SHOULDER ARTHROSCOPY WITH SUBACROMIAL DECOMPRESSION DISTAL CLAVICLE RESECTON LONG HEAD BICEP  TENDODESIS ;  Surgeon: Mable Paris, MD;  Location: [facility];  Service: Orthopedics;  Laterality: Right;   History   Social History  . Marital Status: Married    Spouse Name: N/A    Number of Children: 3  . Years of Education: N/A   Occupational History  . electrician    Social History Main Topics  . Smoking status: Current Some Day Smoker  . Smokeless tobacco: Never Used     Comment: 1/3 ppd used Chantix , helped temporarily  . Alcohol Use: Yes     Comment: socially  . Drug Use: No  . Sexual Activity: Not on file   Other Topics Concern  . Not on file   Social History Narrative   Household-- pt, wife, 1 daughter and 1 G child   3 children, 4 Gkids   Family History  Problem Relation Age of Onset  . Coronary artery disease Neg Hx   . Colon cancer Neg Hx   . Prostate cancer Neg Hx   . Hypertension Mother     F and M  . Diabetes     M&F family  . Stroke Paternal Aunt   . Dementia Maternal Grandmother   . Alcohol abuse Father   . Lung cancer Father    Review of Systems Diet-- healthy Exercise-- going back to a routine after shoulder surgery No  CP, SOB  Denies  nausea, vomiting diarrhea Denies  blood in the stools No GERD  Sx. (on meds) No dysphagia or odynophagia (-) cough, sputum production, (-) wheezing  (-)hemoptysis No dysuria, gross hematuria, difficulty urinating   No suicidal or violent thoughts.       Objective:   Physical Exam  BP 126/78  Pulse 75  Temp(Src) 98.9 F (37.2 C)  SpO2 98% General -- alert, well-developed, NAD.  Neck --no thyromegaly  Lungs -- normal respiratory effort, no intercostal retractions, no accessory muscle use, and normal breath sounds.  Heart-- normal rate, regular rhythm, no murmur.  Abdomen-- Not distended, good bowel sounds,soft, non-tender. No rebound or rigidity. No mass,organomegaly. Extremities-- no pretibial edema bilaterally  Neurologic--  alert & oriented X3. Speech normal, gait normal, strength normal in all extremities.  Psych-- Cognition and judgment appear intact. Cooperative with normal attention span and concentration. No anxious appearing , no depressed appearing

## 2013-09-10 LAB — CBC WITH DIFFERENTIAL/PLATELET
Basophils Absolute: 0.1 10*3/uL (ref 0.0–0.1)
Lymphocytes Relative: 30 % (ref 12.0–46.0)
Monocytes Relative: 7.1 % (ref 3.0–12.0)
Neutrophils Relative %: 58.7 % (ref 43.0–77.0)
Platelets: 332 10*3/uL (ref 150.0–400.0)
RDW: 13.1 % (ref 11.5–14.6)

## 2013-09-10 LAB — LIPID PANEL
HDL: 54.5 mg/dL (ref >39.00)
Triglycerides: 85 mg/dL (ref 0.0–149.0)

## 2013-09-10 LAB — COMPREHENSIVE METABOLIC PANEL
ALT: 36 U/L (ref 0–53)
Albumin: 4.5 g/dL (ref 3.5–5.2)
CO2: 27 mEq/L (ref 19–32)
Calcium: 9.5 mg/dL (ref 8.4–10.5)
Chloride: 105 mEq/L (ref 96–112)
Creatinine, Ser: 1 mg/dL (ref 0.4–1.5)
GFR: 106.71 mL/min (ref >60.00)
Potassium: 4.4 mEq/L (ref 3.5–5.1)
Sodium: 139 mEq/L (ref 135–145)
Total Protein: 8.3 g/dL (ref 6.0–8.3)

## 2013-09-10 LAB — TSH: TSH: 0.9 u[IU]/mL (ref 0.35–5.50)

## 2013-09-13 ENCOUNTER — Encounter: Payer: Self-pay | Admitting: *Deleted

## 2013-09-22 ENCOUNTER — Other Ambulatory Visit: Payer: Self-pay | Admitting: Internal Medicine

## 2013-09-26 ENCOUNTER — Telehealth: Payer: Self-pay | Admitting: *Deleted

## 2013-09-26 ENCOUNTER — Other Ambulatory Visit: Payer: Self-pay | Admitting: Internal Medicine

## 2013-09-26 MED ORDER — ZOLPIDEM TARTRATE 10 MG PO TABS: 5.0000 mg | ORAL_TABLET | Freq: Every evening | ORAL | Status: DC | PRN

## 2013-09-26 NOTE — Telephone Encounter (Signed)
Script faxed.

## 2013-09-26 NOTE — Telephone Encounter (Signed)
Rx printed, no need for UDS since he is only taking Ambien

## 2013-09-26 NOTE — Telephone Encounter (Signed)
zolpidem (AMBIEN) 10 MG tablet Last OV: 11.3.14 Last refill: 5.8.14 #30, 5 refills No contract or UDS on file.

## 2013-10-16 ENCOUNTER — Ambulatory Visit: Payer: BC Managed Care – PPO | Admitting: Internal Medicine

## 2013-10-23 ENCOUNTER — Other Ambulatory Visit: Payer: Self-pay | Admitting: Internal Medicine

## 2013-10-23 NOTE — Telephone Encounter (Signed)
rx refilled per protocol. DJR  

## 2013-11-04 ENCOUNTER — Other Ambulatory Visit: Payer: Self-pay | Admitting: Internal Medicine

## 2013-11-04 NOTE — Telephone Encounter (Signed)
Citalopram refilled per protocol. JG//CMA 

## 2013-11-10 ENCOUNTER — Other Ambulatory Visit: Payer: Self-pay | Admitting: Internal Medicine

## 2013-11-28 ENCOUNTER — Other Ambulatory Visit: Payer: Self-pay | Admitting: Internal Medicine

## 2014-01-05 ENCOUNTER — Other Ambulatory Visit: Payer: Self-pay | Admitting: Internal Medicine

## 2014-02-01 ENCOUNTER — Other Ambulatory Visit: Payer: Self-pay | Admitting: Internal Medicine

## 2014-02-02 ENCOUNTER — Other Ambulatory Visit: Payer: Self-pay | Admitting: Internal Medicine

## 2014-02-13 ENCOUNTER — Encounter: Payer: Self-pay | Admitting: Lab

## 2014-02-13 ENCOUNTER — Telehealth: Payer: Self-pay | Admitting: Internal Medicine

## 2014-02-13 ENCOUNTER — Ambulatory Visit (INDEPENDENT_AMBULATORY_CARE_PROVIDER_SITE_OTHER): Payer: BC Managed Care – PPO | Admitting: Internal Medicine

## 2014-02-13 ENCOUNTER — Encounter: Payer: Self-pay | Admitting: Internal Medicine

## 2014-02-13 VITALS — BP 125/75 | HR 79 | Temp 97.8°F | Wt 205.0 lb

## 2014-02-13 DIAGNOSIS — F411 Generalized anxiety disorder: Secondary | ICD-10-CM

## 2014-02-13 DIAGNOSIS — G4700 Insomnia, unspecified: Secondary | ICD-10-CM

## 2014-02-13 DIAGNOSIS — F419 Anxiety disorder, unspecified: Secondary | ICD-10-CM

## 2014-02-13 DIAGNOSIS — F528 Other sexual dysfunction not due to a substance or known physiological condition: Secondary | ICD-10-CM

## 2014-02-13 HISTORY — DX: Anxiety disorder, unspecified: F41.9

## 2014-02-13 MED ORDER — LORAZEPAM 1 MG PO TABS: 1.0000 mg | ORAL_TABLET | Freq: Every evening | ORAL | Status: DC | PRN

## 2014-02-13 MED ORDER — SILDENAFIL CITRATE 100 MG PO TABS: 50.0000 mg | ORAL_TABLET | Freq: Every day | ORAL | Status: DC | PRN

## 2014-02-13 MED ORDER — CITALOPRAM HYDROBROMIDE 40 MG PO TABS: 40.0000 mg | ORAL_TABLET | Freq: Every day | ORAL | Status: DC

## 2014-02-13 NOTE — Telephone Encounter (Signed)
Relevant patient education mailed to patient.  

## 2014-02-13 NOTE — Assessment & Plan Note (Addendum)
Having symptoms of anxiety, feeling ' jumpy" and irritable. Some depression. A lot of things going on in his life including the loss of his father 09-2013, sister with breast cancer. Started citalopram 20 mg few months ago. Symptoms not well-controlled. Patient was  provided emotional support today. Increase citalopram to 40 mg Recommend counseling And stay physically active See insomnia  Today , I spent more than 15 min with the patient, >50% of the time counseling

## 2014-02-13 NOTE — Assessment & Plan Note (Signed)
RF viagra 

## 2014-02-13 NOTE — Progress Notes (Signed)
Subjective:    Patient ID: Keith West, male    DOB: 09-05-1971, 43 y.o.   MRN: [id number]  DOS:  02/13/2014 Type of  Visit: Followup from previous visit. Anxiety, Continue with a number of challenges in his life, loss his father in November, shortly after  his sister was diagnosed with cancer. Going to [place] frequently to help his mother. Tobacco, status post Chantix, smoking less. Needs a refill on Viagra when necessary   ROS Not sleeping well, wakes up at 2:30 in the morning, requiring 2 tablets of Ambien to sleep more. Denies suicidal or violent ideas. Denies nausea, vomiting, diarrhea. Has not reached out  for counseling yet.  Past Medical History  Diagnosis Date  . GERD (gastroesophageal reflux disease)   . Heart murmur 1999    ECHO NI 2005-dr Maty Zeisler had done  . Kidney stones 2000  . ED (erectile dysfunction)     Past Surgical History  Procedure Laterality Date  . Bunionectomy  1991    right  . Dental surgery    . Shoulder arthroscopy with subacromial decompression and bicep tendon repair Right 03/25/2013    Procedure: RIGHT SHOULDER ARTHROSCOPY WITH SUBACROMIAL DECOMPRESSION DISTAL CLAVICLE RESECTON LONG HEAD BICEP  TENDODESIS ;  Surgeon: Mable ParisJustin William Chandler, MD;  Location: [facility];  Service: Orthopedics;  Laterality: Right;    History   Social History  . Marital Status: Married    Spouse Name: N/A    Number of Children: 3  . Years of Education: N/A   Occupational History  . electrician    Social History Main Topics  . Smoking status: Current Some Day Smoker  . Smokeless tobacco: Never Used     Comment: 1/3 ppd used Chantix , helped temporarily  . Alcohol Use: Yes     Comment: socially  . Drug Use: No  . Sexual Activity: Not on file   Other Topics Concern  . Not on file   Social History Narrative   Household-- pt, wife, 1 daughter and 1 G child   3 children, 4 Gkids        Medication List       This list is  accurate as of: 02/13/14  2:40 PM.  Always use your most recent med list.               amLODipine 10 MG tablet  Commonly known as:  NORVASC  TAKE 1 TABLET EVERY DAY CALL [phone]     citalopram 40 MG tablet  Commonly known as:  CELEXA  Take 1 tablet (40 mg total) by mouth daily.     LORazepam 1 MG tablet  Commonly known as:  ATIVAN  Take 1-1.5 tablets (1-1.5 mg total) by mouth at bedtime as needed for sleep.     ranitidine 150 MG tablet  Commonly known as:  ZANTAC  Take 150 mg by mouth at bedtime.     sildenafil 100 MG tablet  Commonly known as:  VIAGRA  Take 0.5-1 tablets (50-100 mg total) by mouth daily as needed for erectile dysfunction.           Objective:   Physical Exam BP 125/75  Pulse 79  Temp(Src) 97.8 F (36.6 C)  Wt 205 lb (92.987 kg)  SpO2 97% General -- alert, well-developed, NAD.   Psych-- Cognition and judgment appear intact. Cooperative with normal attention span and concentration. No anxious or depressed appearing.      Assessment & Plan:

## 2014-02-13 NOTE — Patient Instructions (Signed)
Get your labs before you leave ---> UDS    Next visit is for routine check up   in 2 months  No need to come back fasting Please make an appointment

## 2014-02-13 NOTE — Assessment & Plan Note (Signed)
Not well-controlled with Ambien, sometimes requiring 2 tablets. Change to Ativan 1 mg, 1 or 1.5 tablets at bedtime.

## 2014-02-13 NOTE — Progress Notes (Signed)
Pre visit review using our clinic review tool, if applicable. No additional management support is needed unless otherwise documented below in the visit note. 

## 2014-03-06 ENCOUNTER — Telehealth: Payer: Self-pay

## 2014-03-06 ENCOUNTER — Other Ambulatory Visit: Payer: Self-pay | Admitting: Internal Medicine

## 2014-03-06 NOTE — Telephone Encounter (Signed)
UDS: 02/13/2014 Negative for ativan (new rx) Per Dr Drue NovelPaz Low risk

## 2014-03-28 ENCOUNTER — Encounter: Payer: Self-pay | Admitting: Internal Medicine

## 2014-05-22 ENCOUNTER — Emergency Department (HOSPITAL_COMMUNITY)
Admission: EM | Admit: 2014-05-22 | Discharge: 2014-05-22 | Disposition: A | Discharge status: Home / Self Care | Payer: BC Managed Care – PPO | Attending: Emergency Medicine | Admitting: Emergency Medicine

## 2014-05-22 ENCOUNTER — Emergency Department (HOSPITAL_COMMUNITY): Payer: BC Managed Care – PPO

## 2014-05-22 ENCOUNTER — Ambulatory Visit: Payer: BC Managed Care – PPO | Admitting: Medical

## 2014-05-22 ENCOUNTER — Encounter (HOSPITAL_COMMUNITY): Payer: Self-pay | Admitting: Emergency Medicine

## 2014-05-22 DIAGNOSIS — F17200 Nicotine dependence, unspecified, uncomplicated: Secondary | ICD-10-CM | POA: Insufficient documentation

## 2014-05-22 DIAGNOSIS — M5432 Sciatica, left side: Secondary | ICD-10-CM

## 2014-05-22 DIAGNOSIS — K219 Gastro-esophageal reflux disease without esophagitis: Secondary | ICD-10-CM | POA: Insufficient documentation

## 2014-05-22 DIAGNOSIS — M5430 Sciatica, unspecified side: Secondary | ICD-10-CM | POA: Insufficient documentation

## 2014-05-22 DIAGNOSIS — N529 Male erectile dysfunction, unspecified: Secondary | ICD-10-CM | POA: Insufficient documentation

## 2014-05-22 DIAGNOSIS — R011 Cardiac murmur, unspecified: Secondary | ICD-10-CM | POA: Insufficient documentation

## 2014-05-22 DIAGNOSIS — Z87442 Personal history of urinary calculi: Secondary | ICD-10-CM | POA: Insufficient documentation

## 2014-05-22 DIAGNOSIS — Z79899 Other long term (current) drug therapy: Secondary | ICD-10-CM | POA: Insufficient documentation

## 2014-05-22 LAB — URINALYSIS, ROUTINE W REFLEX MICROSCOPIC
Bilirubin Urine: NEGATIVE
Glucose, UA: NEGATIVE mg/dL
HGB URINE DIPSTICK: NEGATIVE
Ketones, ur: NEGATIVE mg/dL
NITRITE: NEGATIVE
PROTEIN: NEGATIVE mg/dL
Specific Gravity, Urine: 1.022 (ref 1.005–1.030)
UROBILINOGEN UA: 0.2 mg/dL (ref 0.0–1.0)
pH: 5.5 (ref 5.0–8.0)

## 2014-05-22 LAB — URINE MICROSCOPIC-ADD ON

## 2014-05-22 MED ORDER — OXYCODONE-ACETAMINOPHEN 5-325 MG PO TABS
1.0000 | ORAL_TABLET | Freq: Once | ORAL | Status: AC
  Administered 2014-05-22: 1 via ORAL
  Filled 2014-05-22: qty 1

## 2014-05-22 MED ORDER — OXYCODONE-ACETAMINOPHEN 5-325 MG PO TABS: 1.0000 | ORAL_TABLET | Freq: Four times a day (QID) | ORAL | Status: DC | PRN

## 2014-05-22 NOTE — ED Notes (Signed)
Pt c/o left lower back and flank pain x 2 days; pt sts hx of kidney stone and feels same

## 2014-05-22 NOTE — ED Notes (Signed)
Pt is getting dressed and waiting at bedside for discharge paperwork.

## 2014-05-22 NOTE — ED Provider Notes (Signed)
CSN: [id number]     Arrival date & time 05/22/14  1239 History   First MD Initiated Contact with Patient 05/22/14 1723     Chief Complaint  Patient presents with  . Flank Pain     (Consider location/radiation/quality/duration/timing/severity/associated sxs/prior Treatment) Patient is a 43 y.o. male presenting with flank pain.  Flank Pain This is a recurrent problem. The current episode started today. The problem occurs constantly. The problem has been gradually improving. Associated symptoms include urinary symptoms. Pertinent negatives include no abdominal pain, chest pain, chills, coughing, fever, headaches, nausea, neck pain, numbness, rash, sore throat, visual change or vomiting. Nothing aggravates the symptoms. He has tried nothing for the symptoms.    Past Medical History  Diagnosis Date  . GERD (gastroesophageal reflux disease)   . Heart murmur 1999    ECHO NI 2005-dr paz had done  . ED (erectile dysfunction)   . Kidney stones 2000   Past Surgical History  Procedure Laterality Date  . Bunionectomy  1991    right  . Dental surgery    . Shoulder arthroscopy with subacromial decompression and bicep tendon repair Right 03/25/2013    Procedure: RIGHT SHOULDER ARTHROSCOPY WITH SUBACROMIAL DECOMPRESSION DISTAL CLAVICLE RESECTON LONG HEAD BICEP  TENDODESIS ;  Surgeon: Mable ParisJustin William Chandler, MD;  Location: [facility];  Service: Orthopedics;  Laterality: Right;   Family History  Problem Relation Age of Onset  . Coronary artery disease Neg Hx   . Colon cancer Neg Hx   . Prostate cancer Neg Hx   . Hypertension Mother     F and M  . Diabetes      M&F family  . Stroke Paternal Aunt   . Dementia Maternal Grandmother   . Alcohol abuse Father   . Lung cancer Father    History  Substance Use Topics  . Smoking status: Current Some Day Smoker  . Smokeless tobacco: Never Used     Comment: 1/3 ppd used Chantix , helped temporarily  . Alcohol Use: Yes     Comment:  socially    Review of Systems  Constitutional: Negative for fever and chills.  HENT: Negative for sore throat.   Eyes: Negative for pain.  Respiratory: Negative for cough and shortness of breath.   Cardiovascular: Negative for chest pain.  Gastrointestinal: Negative for nausea, vomiting and abdominal pain.  Genitourinary: Positive for flank pain. Negative for dysuria.  Musculoskeletal: Positive for back pain. Negative for neck pain.  Skin: Negative for rash.  Neurological: Negative for seizures, numbness and headaches.      Allergies  Hydrocodone-acetaminophen  Home Medications   Prior to Admission medications   Medication Sig Start Date End Date Taking? Authorizing Provider  amLODipine (NORVASC) 10 MG tablet Take 10 mg by mouth daily.   Yes Historical Provider, MD  citalopram (CELEXA) 40 MG tablet Take 1 tablet (40 mg total) by mouth daily. 02/13/14  Yes Wanda PlumpJose E Paz, MD  LORazepam (ATIVAN) 1 MG tablet Take 1-1.5 tablets (1-1.5 mg total) by mouth at bedtime as needed for sleep. 02/13/14  Yes Wanda PlumpJose E Paz, MD  ranitidine (ZANTAC) 150 MG tablet Take 150 mg by mouth at bedtime.     Yes Historical Provider, MD  sildenafil (VIAGRA) 100 MG tablet Take 0.5-1 tablets (50-100 mg total) by mouth daily as needed for erectile dysfunction. 02/13/14  Yes Wanda PlumpJose E Paz, MD  oxyCODONE-acetaminophen (PERCOCET/ROXICET) 5-325 MG per tablet Take 1 tablet by mouth every 6 (six) hours as needed for severe  pain. 05/22/14   Imagene Sheller, MD   BP 127/80  Pulse 69  Temp(Src) 98 F (36.7 C) (Oral)  Resp 11  Ht 5\' 11"  (1.803 m)  Wt 205 lb (92.987 kg)  BMI 28.60 kg/m2  SpO2 100% Physical Exam  Constitutional: He is oriented to person, place, and time. He appears well-developed and well-nourished. No distress.  HENT:  Head: Normocephalic and atraumatic.  Eyes: Pupils are equal, round, and reactive to light.  Neck: Normal range of motion.  Cardiovascular: Normal rate and regular rhythm.   Pulmonary/Chest: Effort  normal and breath sounds normal.  Abdominal: Soft. He exhibits no distension. There is no tenderness. There is no rigidity, no guarding, no CVA tenderness (No left flank CVA) and no tenderness at McBurney's point.  Musculoskeletal: Normal range of motion.       Lumbar back: He exhibits tenderness (L paraspinal tenderness; + straight leg test). He exhibits no bony tenderness.  Neurological: He is alert and oriented to person, place, and time.  Skin: Skin is warm. He is not diaphoretic.    ED Course  Procedures (including critical care time) Labs Review Labs Reviewed  URINALYSIS, ROUTINE W REFLEX MICROSCOPIC - Abnormal; Notable for the following:    Leukocytes, UA SMALL (*)    All other components within normal limits  URINE MICROSCOPIC-ADD ON - Abnormal; Notable for the following:    Squamous Epithelial / LPF FEW (*)    All other components within normal limits  URINE CULTURE    Imaging Review US Renal  05/22/2014   CLINICAL DATA:  Flank pain.  EXAM: RENAL/URINARY TRACT ULTRASOUND COMPLETE  COMPARISON:  None.  FINDINGS: Right Kidney:  Length: 11.6 cm. Echogenicity within normal limits. No mass or hydronephrosis visualized.  Left Kidney:  Length: 11.0 cm. Echogenicity within normal limits. No mass or hydronephrosis visualized.  Bladder:  Appears normal for degree of bladder distention. Liver is echogenic suggesting fatty infiltration.  IMPRESSION: 1. No focal renal abnormalities identified. No hydronephrosis. Bladder unremarkable. 2. Echogenic liver suggesting fatty infiltration.   Electronically Signed   By: Maisie Fus  Register   On: 05/22/2014 16:46     EKG Interpretation None      MDM   Final diagnoses:  Sciatica, left   43 yo M with hx of GERD, prior kidney stones presents with L lower back pain.   Patient appears well. In NAD. Patient laughing while watching television on my arrival. On exam, patient with + straight leg test. Likely sciatica. Doubt kidney stone, but [place] already  performed, no obvious abnormalities nor hydronephrosis. No blood on UA. Will recommend NSAID use for therapy, stretching exercises. Will recommend follow-up with PCP as needed. Discharged to home in stable condition. Patient seen and evaluated by myself and my attending, Dr. Rosalia Hammers.      Imagene Sheller, MD 05/23/14 2024

## 2014-05-22 NOTE — Discharge Instructions (Signed)

## 2014-05-22 NOTE — ED Notes (Signed)
Pt continues to be monitored by 5 lead, blood pressure, and pulse ox.  

## 2014-05-23 LAB — URINE CULTURE: Colony Count: 9000

## 2014-05-25 NOTE — ED Provider Notes (Signed)
43 y.o male with left lower back pain. PE- no abnormality on back, abdomen, or neuro exam.   I performed a history and physical examination of Keith West and discussed his management with Dr. Gordy LevanWalton.  I agree with the history, physical, assessment, and plan of care, with the following exceptions: None  I was present for the following procedures: None Time Spent in Critical Care of the patient: None Time spent in discussions with the patient and family: 5  Keith West    Keith Quarryanielle West Houa Ackert, MD 05/25/14 1601

## 2014-06-15 ENCOUNTER — Other Ambulatory Visit: Payer: Self-pay | Admitting: Internal Medicine

## 2014-06-16 ENCOUNTER — Other Ambulatory Visit: Payer: Self-pay | Admitting: Internal Medicine

## 2014-07-20 ENCOUNTER — Other Ambulatory Visit: Payer: Self-pay | Admitting: Internal Medicine

## 2014-07-28 ENCOUNTER — Other Ambulatory Visit: Payer: Self-pay

## 2014-07-28 MED ORDER — AMLODIPINE BESYLATE 10 MG PO TABS: ORAL_TABLET | ORAL | Status: DC

## 2014-08-12 ENCOUNTER — Ambulatory Visit (INDEPENDENT_AMBULATORY_CARE_PROVIDER_SITE_OTHER): Payer: BC Managed Care – PPO | Admitting: Internal Medicine

## 2014-08-12 ENCOUNTER — Encounter: Payer: Self-pay | Admitting: Internal Medicine

## 2014-08-12 VITALS — BP 147/91 | HR 76 | Temp 98.8°F | Ht 71.0 in | Wt 202.2 lb

## 2014-08-12 DIAGNOSIS — Z0000 Encounter for general adult medical examination without abnormal findings: Secondary | ICD-10-CM

## 2014-08-12 DIAGNOSIS — F419 Anxiety disorder, unspecified: Secondary | ICD-10-CM

## 2014-08-12 DIAGNOSIS — G4700 Insomnia, unspecified: Secondary | ICD-10-CM

## 2014-08-12 DIAGNOSIS — I10 Essential (primary) hypertension: Secondary | ICD-10-CM

## 2014-08-12 MED ORDER — AMLODIPINE BESYLATE 10 MG PO TABS
ORAL_TABLET | ORAL | Status: DC
Start: 2014-08-12 — End: 2015-09-09

## 2014-08-12 MED ORDER — LORAZEPAM 1 MG PO TABS: 1.0000 mg | ORAL_TABLET | Freq: Every evening | ORAL | Status: DC | PRN

## 2014-08-12 MED ORDER — VARENICLINE TARTRATE 1 MG PO TABS: 1.0000 mg | ORAL_TABLET | Freq: Two times a day (BID) | ORAL | Status: DC

## 2014-08-12 MED ORDER — VARENICLINE TARTRATE 0.5 MG X 11 & 1 MG X 42 PO MISC: ORAL | Status: DC

## 2014-08-12 NOTE — Progress Notes (Signed)
Subjective:    Patient ID: Keith West, male    DOB: [birth year], 43 y.o.   MRN: [id number]  DOS:  08/12/2014 Type of visit - description : CPX Interval history: doing well in general     ROS Diet-- trying to eat healthy Exercise-- active, goes to the gym ~ 2-3 times x week No  CP, SOB Denies  nausea, vomiting diarrhea, blood in the stools (-) cough, sputum production (-) wheezing, chest congestion No dysuria, gross hematuria, difficulty urinating  No headaches. Denies dizziness   Past Medical History  Diagnosis Date  . GERD (gastroesophageal reflux disease)   . Heart murmur 1999    ECHO NI 2005-dr Sehar Sedano had done  . ED (erectile dysfunction)   . Kidney stones 2000    Past Surgical History  Procedure Laterality Date  . Bunionectomy  1991    right  . Dental surgery    . Shoulder arthroscopy with subacromial decompression and bicep tendon repair Right 03/25/2013    Procedure: RIGHT SHOULDER ARTHROSCOPY WITH SUBACROMIAL DECOMPRESSION DISTAL CLAVICLE RESECTON LONG HEAD BICEP  TENDODESIS ;  Surgeon: Mable ParisJustin William Chandler, MD;  Location: [facility];  Service: Orthopedics;  Laterality: Right;    History   Social History  . Marital Status: Married    Spouse Name: N/A    Number of Children: 3  . Years of Education: N/A   Occupational History  . electrician    Social History Main Topics  . Smoking status: Current Some Day Smoker  . Smokeless tobacco: Never Used     Comment: < 1 ppd used   . Alcohol Use: Yes     Comment: socially  . Drug Use: No  . Sexual Activity: Not on file   Other Topics Concern  . Not on file   Social History Narrative   Household-- pt, wife, 1 daughter and 1 G child   3 children, 4 Gkids     Family History  Problem Relation Age of Onset  . Coronary artery disease Neg Hx   . Colon cancer Neg Hx   . Prostate cancer Neg Hx   . Hypertension Mother     F and M  . Diabetes Other     M&F family  . Stroke Paternal Aunt     . Dementia Maternal Grandmother   . Alcohol abuse Father   . Lung cancer Father   . Breast cancer Sister        Medication List       This list is accurate as of: 08/12/14  8:27 PM.  Always use your most recent med list.               amLODipine 10 MG tablet  Commonly known as:  NORVASC  TAKE 1 TABLET EVERY DAY     citalopram 40 MG tablet  Commonly known as:  CELEXA  TAKE 1 TABLET (40 MG TOTAL) BY MOUTH DAILY.     LORazepam 1 MG tablet  Commonly known as:  ATIVAN  Take 1-1.5 tablets (1-1.5 mg total) by mouth at bedtime as needed for sleep.     ranitidine 150 MG tablet  Commonly known as:  ZANTAC  Take 150 mg by mouth at bedtime.     sildenafil 100 MG tablet  Commonly known as:  VIAGRA  Take 0.5-1 tablets (50-100 mg total) by mouth daily as needed for erectile dysfunction.     varenicline 1 MG tablet  Commonly known as:  CHANTIX CONTINUING  MONTH PAK  Take 1 tablet (1 mg total) by mouth 2 (two) times daily.     varenicline 0.5 MG X 11 & 1 MG X 42 tablet  Commonly known as:  CHANTIX STARTING MONTH PAK  Take one 0.5 mg tablet by mouth once daily for 3 days, then increase to one 0.5 mg tablet twice daily for 4 days, then increase to one 1 mg tablet twice daily.           Objective:   Physical Exam BP 147/91  Pulse 76  Temp(Src) 98.8 F (37.1 C) (Oral)  Ht 5\' 11"  (1.803 m)  Wt 202 lb 4 oz (91.74 kg)  BMI 28.22 kg/m2  SpO2 95%  General -- alert, well-developed, NAD.  Neck --no thyromegaly , normal carotid pulse  HEENT-- Not pale.   Lungs -- normal respiratory effort, no intercostal retractions, no accessory muscle use, and normal breath sounds.  Heart-- normal rate, regular rhythm, no murmur.  Abdomen-- Not distended, good bowel sounds,soft, non-tender. Rectal-- No external abnormalities noted. Normal sphincter tone. No rectal masses or tenderness. Stool brown Prostate--Prostate gland firm and smooth, no enlargement, nodularity, tenderness, mass, asymmetry  or induration. Extremities-- no pretibial edema bilaterally  Neurologic--  alert & oriented X3. Speech normal, gait appropriate for age, strength symmetric and appropriate for age.  Psych-- Cognition and judgment appear intact. Cooperative with normal attention span and concentration. No anxious or depressed appearing.       Assessment & Plan:

## 2014-08-12 NOTE — Assessment & Plan Note (Signed)
Taking Ativan as needed only, UDS 02/2014 negative, recheck a UDS on return to the office

## 2014-08-12 NOTE — Progress Notes (Signed)
Pre visit review using our clinic review tool, if applicable. No additional management support is needed unless otherwise documented below in the visit note. 

## 2014-08-12 NOTE — Assessment & Plan Note (Addendum)
Good compliance of medication, ambulatory BPs 120, 130

## 2014-08-12 NOTE — Assessment & Plan Note (Signed)
Currently well controlled, but he is tender, followup 6 months

## 2014-08-12 NOTE — Assessment & Plan Note (Addendum)
Td 07 Flu shot declined  Diet-exercise doing well, praised  Tobacco counseling, risk of smoking discussed (COPD, cancer), Sees dentist regularly. Prescribe Chantix again Patient is a smoker and African American, will start prostate cancer screening --> DRE negative today will check a PSA; screen  every 2 or 3 years until 43 y/o Labs

## 2014-08-12 NOTE — Patient Instructions (Signed)
Get your blood work before you leave   Start taking Chantix: First month the starting pack,  then one tablet twice a day Quit tobacco 15 days after you start Chantix Call if side effects or problems    Please come back to the office in 6 months  for a routine check up  No  fasting     REMINDERS It is extremely important to know what medications you take, please bring all bottles with you (or an accurate medication list) for every visit  If we are ordering labs, XRs or referring you to a specialist : we will always communicate to you the results or the time of the appointment within few days. If you don't hear from us please call the office    Smoking Cessation Quitting smoking is important to your health and has many advantages. However, it is not always easy to quit since nicotine is a very addictive drug. Oftentimes, people try 3 times or more before being able to quit. This document explains the best ways for you to prepare to quit smoking. Quitting takes hard work and a lot of effort, but you can do it. ADVANTAGES OF QUITTING SMOKING  You will live longer, feel better, and live better.  Your body will feel the impact of quitting smoking almost immediately.  Within 20 minutes, blood pressure decreases. Your pulse returns to its normal level.  After 8 hours, carbon monoxide levels in the blood return to normal. Your oxygen level increases.  After 24 hours, the chance of having a heart attack starts to decrease. Your breath, hair, and body stop smelling like smoke.  After 48 hours, damaged nerve endings begin to recover. Your sense of taste and smell improve.  After 72 hours, the body is virtually free of nicotine. Your bronchial tubes relax and breathing becomes easier.  After 2 to 12 weeks, lungs can hold more air. Exercise becomes easier and circulation improves.  The risk of having a heart attack, stroke, cancer, or lung disease is greatly reduced.  After 1 year, the risk  of coronary heart disease is cut in half.  After 5 years, the risk of stroke falls to the same as a nonsmoker.  After 10 years, the risk of lung cancer is cut in half and the risk of other cancers decreases significantly.  After 15 years, the risk of coronary heart disease drops, usually to the level of a nonsmoker.  If you are pregnant, quitting smoking will improve your chances of having a healthy baby.  The people you live with, especially any children, will be healthier.  You will have extra money to spend on things other than cigarettes. QUESTIONS TO THINK ABOUT BEFORE ATTEMPTING TO QUIT You may want to talk about your answers with your health care provider.  Why do you want to quit?  If you tried to quit in the past, what helped and what did not?  What will be the most difficult situations for you after you quit? How will you plan to handle them?  Who can help you through the tough times? Your family? Friends? A health care provider?  What pleasures do you get from smoking? What ways can you still get pleasure if you quit? Here are some questions to ask your health care provider:  How can you help me to be successful at quitting?  What medicine do you think would be best for me and how should I take it?  What should I do if  I need more help?  What is smoking withdrawal like? How can I get information on withdrawal? GET READY  Set a quit date.  Change your environment by getting rid of all cigarettes, ashtrays, matches, and lighters in your home, car, or work. Do not let people smoke in your home.  Review your past attempts to quit. Think about what worked and what did not. GET SUPPORT AND ENCOURAGEMENT You have a better chance of being successful if you have help. You can get support in many ways.  Tell your family, friends, and coworkers that you are going to quit and need their support. Ask them not to smoke around you.  Get individual, group, or telephone  counseling and support. Programs are available at [employer] and health centers. Call your local health department for information about programs in your area.  Spiritual beliefs and practices may help some smokers quit.  Download a "quit meter" on your computer to keep track of quit statistics, such as how long you have gone without smoking, cigarettes not smoked, and money saved.  Get a self-help book about quitting smoking and staying off tobacco. LEARN NEW SKILLS AND BEHAVIORS  Distract yourself from urges to smoke. Talk to someone, go for a walk, or occupy your time with a task.  Change your normal routine. Take a different route to work. Drink tea instead of coffee. Eat breakfast in a different place.  Reduce your stress. Take a hot bath, exercise, or read a book.  Plan something enjoyable to do every day. Reward yourself for not smoking.  Explore interactive web-based programs that specialize in helping you quit. GET MEDICINE AND USE IT CORRECTLY Medicines can help you stop smoking and decrease the urge to smoke. Combining medicine with the above behavioral methods and support can greatly increase your chances of successfully quitting smoking.  Nicotine replacement therapy helps deliver nicotine to your body without the negative effects and risks of smoking. Nicotine replacement therapy includes nicotine gum, lozenges, inhalers, nasal sprays, and skin patches. Some may be available over-the-counter and others require a prescription.  Antidepressant medicine helps people abstain from smoking, but how this works is unknown. This medicine is available by prescription.  Nicotinic receptor partial agonist medicine simulates the effect of nicotine in your brain. This medicine is available by prescription. Ask your health care provider for advice about which medicines to use and how to use them based on your health history. Your health care provider will tell you what side effects to  look out for if you choose to be on a medicine or therapy. Carefully read the information on the package. Do not use any other product containing nicotine while using a nicotine replacement product.  RELAPSE OR DIFFICULT SITUATIONS Most relapses occur within the first 3 months after quitting. Do not be discouraged if you start smoking again. Remember, most people try several times before finally quitting. You may have symptoms of withdrawal because your body is used to nicotine. You may crave cigarettes, be irritable, feel very hungry, cough often, get headaches, or have difficulty concentrating. The withdrawal symptoms are only temporary. They are strongest when you first quit, but they will go away within 10-14 days. To reduce the chances of relapse, try to:  Avoid drinking alcohol. Drinking lowers your chances of successfully quitting.  Reduce the amount of caffeine you consume. Once you quit smoking, the amount of caffeine in your body increases and can give you symptoms, such as a rapid heartbeat, sweating,  and anxiety.  Avoid smokers because they can make you want to smoke.  Do not let weight gain distract you. Many smokers will gain weight when they quit, usually less than 10 pounds. Eat a healthy diet and stay active. You can always lose the weight gained after you quit.  Find ways to improve your mood other than smoking. FOR MORE INFORMATION  www.smokefree.gov  Document Released: 10/18/2001 Document Revised: 03/10/2014 Document Reviewed: 02/02/2012 [facility] Patient Information 2015 [place], [place]. This information is not intended to replace advice given to you by your health care provider. Make sure you discuss any questions you have with your health care provider.

## 2014-08-13 ENCOUNTER — Telehealth: Payer: Self-pay | Admitting: Internal Medicine

## 2014-08-13 LAB — COMPREHENSIVE METABOLIC PANEL
ALK PHOS: 95 U/L (ref 39–117)
ALT: 48 U/L (ref 0–53)
AST: 44 U/L — AB (ref 0–37)
Albumin: 4 g/dL (ref 3.5–5.2)
BUN: 7 mg/dL (ref 6–23)
CO2: 26 mEq/L (ref 19–32)
CREATININE: 1 mg/dL (ref 0.4–1.5)
Calcium: 9.4 mg/dL (ref 8.4–10.5)
Chloride: 103 mEq/L (ref 96–112)
GFR: 110.14 mL/min (ref >60.00)
Glucose, Bld: 72 mg/dL (ref 70–99)
POTASSIUM: 3.9 meq/L (ref 3.5–5.1)
Sodium: 138 mEq/L (ref 135–145)
Total Bilirubin: 1.1 mg/dL (ref 0.2–1.2)
Total Protein: 8 g/dL (ref 6.0–8.3)

## 2014-08-13 LAB — LIPID PANEL
CHOL/HDL RATIO: 3
Cholesterol: 169 mg/dL (ref 0–200)
HDL: 52.6 mg/dL (ref >39.00)
LDL CALC: 95 mg/dL (ref 0–99)
NonHDL: 116.4
TRIGLYCERIDES: 107 mg/dL (ref 0.0–149.0)
VLDL: 21.4 mg/dL (ref 0.0–40.0)

## 2014-08-13 LAB — PSA: PSA: 3.47 ng/mL (ref 0.10–4.00)

## 2014-08-13 NOTE — Telephone Encounter (Signed)
emmi emailed °

## 2014-10-13 ENCOUNTER — Other Ambulatory Visit: Payer: Self-pay | Admitting: Internal Medicine

## 2014-10-26 ENCOUNTER — Other Ambulatory Visit: Payer: Self-pay | Admitting: Internal Medicine

## 2014-11-20 ENCOUNTER — Emergency Department (HOSPITAL_COMMUNITY)
Admission: EM | Admit: 2014-11-20 | Discharge: 2014-11-20 | Disposition: A | Discharge status: Home / Self Care | Payer: BLUE CROSS/BLUE SHIELD | Attending: Emergency Medicine | Admitting: Emergency Medicine

## 2014-11-20 ENCOUNTER — Encounter (HOSPITAL_COMMUNITY): Payer: Self-pay | Admitting: Emergency Medicine

## 2014-11-20 DIAGNOSIS — Z87442 Personal history of urinary calculi: Secondary | ICD-10-CM | POA: Diagnosis not present

## 2014-11-20 DIAGNOSIS — N529 Male erectile dysfunction, unspecified: Secondary | ICD-10-CM | POA: Insufficient documentation

## 2014-11-20 DIAGNOSIS — Y9389 Activity, other specified: Secondary | ICD-10-CM | POA: Insufficient documentation

## 2014-11-20 DIAGNOSIS — Y998 Other external cause status: Secondary | ICD-10-CM | POA: Insufficient documentation

## 2014-11-20 DIAGNOSIS — R011 Cardiac murmur, unspecified: Secondary | ICD-10-CM | POA: Diagnosis not present

## 2014-11-20 DIAGNOSIS — Y92410 Unspecified street and highway as the place of occurrence of the external cause: Secondary | ICD-10-CM | POA: Diagnosis not present

## 2014-11-20 DIAGNOSIS — Z041 Encounter for examination and observation following transport accident: Secondary | ICD-10-CM

## 2014-11-20 DIAGNOSIS — Z79899 Other long term (current) drug therapy: Secondary | ICD-10-CM | POA: Insufficient documentation

## 2014-11-20 DIAGNOSIS — Z720 Tobacco use: Secondary | ICD-10-CM | POA: Diagnosis not present

## 2014-11-20 DIAGNOSIS — K219 Gastro-esophageal reflux disease without esophagitis: Secondary | ICD-10-CM | POA: Insufficient documentation

## 2014-11-20 DIAGNOSIS — S3992XA Unspecified injury of lower back, initial encounter: Secondary | ICD-10-CM | POA: Insufficient documentation

## 2014-11-20 MED ORDER — IBUPROFEN 800 MG PO TABS: 800.0000 mg | ORAL_TABLET | Freq: Three times a day (TID) | ORAL | Status: DC | PRN

## 2014-11-20 MED ORDER — METHOCARBAMOL 500 MG PO TABS: 500.0000 mg | ORAL_TABLET | Freq: Two times a day (BID) | ORAL | Status: DC
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
## 2014-11-20 NOTE — Discharge Instructions (Signed)

## 2015-02-11 ENCOUNTER — Ambulatory Visit: Payer: BC Managed Care – PPO | Admitting: Internal Medicine

## 2015-02-24 ENCOUNTER — Encounter: Payer: Self-pay | Admitting: Internal Medicine

## 2015-02-24 ENCOUNTER — Ambulatory Visit (INDEPENDENT_AMBULATORY_CARE_PROVIDER_SITE_OTHER): Payer: BLUE CROSS/BLUE SHIELD | Admitting: Internal Medicine

## 2015-02-24 VITALS — BP 128/84 | HR 94 | Temp 98.0°F | Ht 71.0 in | Wt 212.4 lb

## 2015-02-24 DIAGNOSIS — F419 Anxiety disorder, unspecified: Secondary | ICD-10-CM

## 2015-02-24 DIAGNOSIS — K219 Gastro-esophageal reflux disease without esophagitis: Secondary | ICD-10-CM

## 2015-02-24 DIAGNOSIS — G4700 Insomnia, unspecified: Secondary | ICD-10-CM

## 2015-02-24 DIAGNOSIS — I10 Essential (primary) hypertension: Secondary | ICD-10-CM

## 2015-02-24 DIAGNOSIS — R399 Unspecified symptoms and signs involving the genitourinary system: Secondary | ICD-10-CM

## 2015-02-24 DIAGNOSIS — Z720 Tobacco use: Secondary | ICD-10-CM | POA: Diagnosis not present

## 2015-02-24 DIAGNOSIS — F17200 Nicotine dependence, unspecified, uncomplicated: Secondary | ICD-10-CM

## 2015-02-24 MED ORDER — VENLAFAXINE HCL ER 75 MG PO CP24: 150.0000 mg | ORAL_CAPSULE | Freq: Every day | ORAL | Status: DC

## 2015-02-24 NOTE — Assessment & Plan Note (Signed)
Anxiety: Patient stopped taking Celexa due to feeling too passive. Patient notes Ativan is not helping with sleep either.  Plan: Switch Celexa to Effexor and return in six weeks for evaluation. Continue Ativan as currently prescribed because anxiety may be affecting sleep patterns disrupting effectiveness of Ativan. In the past Remus Lofflerambien  worked well for him but he ended up needing more than 1 tablet, nevertheless will consider restart Ambien at some point

## 2015-02-24 NOTE — Assessment & Plan Note (Signed)
Increased urinary frequency: DRE revealed a soft, nontender prostate with mild enlargement. Plan: Conduct urinalysis and check PSA today.

## 2015-02-24 NOTE — Patient Instructions (Signed)
Get your blood work before you leave   Start Effexor: 1 tablet in the morning for the first 2 weeks, then 2 tablets every morning  Come back to the office in 6 weeks   for a routine check up

## 2015-02-24 NOTE — Assessment & Plan Note (Signed)
Chantix has definitely helped the patient cutting back on cigarettes, however he self discontinue it  due to nausea.   Plan: Consider adding Wellbutrin in the future for assistance once anxiety improves, otherwise encouraged to continue cutting back on tobacco abuse .
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
## 2015-02-24 NOTE — Assessment & Plan Note (Signed)
Not well controlled at the present time, see comments under anxiety

## 2015-02-24 NOTE — Progress Notes (Signed)
Pre visit review using our clinic review tool, if applicable. No additional management support is needed unless otherwise documented below in the visit note. 

## 2015-02-25 LAB — URINALYSIS, ROUTINE W REFLEX MICROSCOPIC
Bilirubin Urine: NEGATIVE
Hgb urine dipstick: NEGATIVE
Ketones, ur: NEGATIVE
LEUKOCYTES UA: NEGATIVE
NITRITE: NEGATIVE
PH: 6.5 (ref 5.0–8.0)
Specific Gravity, Urine: 1.025 (ref 1.000–1.030)
Total Protein, Urine: NEGATIVE
UROBILINOGEN UA: 0.2 (ref 0.0–1.0)
Urine Glucose: NEGATIVE

## 2015-02-25 LAB — PSA: PSA: 2.04 ng/mL (ref 0.10–4.00)

## 2015-03-05 ENCOUNTER — Other Ambulatory Visit: Payer: Self-pay

## 2015-03-22 ENCOUNTER — Other Ambulatory Visit: Payer: Self-pay | Admitting: Internal Medicine

## 2015-04-07 ENCOUNTER — Ambulatory Visit (INDEPENDENT_AMBULATORY_CARE_PROVIDER_SITE_OTHER): Payer: BLUE CROSS/BLUE SHIELD | Admitting: Internal Medicine

## 2015-04-07 ENCOUNTER — Encounter: Payer: Self-pay | Admitting: Internal Medicine

## 2015-04-07 VITALS — BP 120/70 | HR 87 | Temp 98.1°F | Ht 71.0 in | Wt 209.5 lb

## 2015-04-07 DIAGNOSIS — F528 Other sexual dysfunction not due to a substance or known physiological condition: Secondary | ICD-10-CM | POA: Diagnosis not present

## 2015-04-07 DIAGNOSIS — F419 Anxiety disorder, unspecified: Secondary | ICD-10-CM

## 2015-04-07 DIAGNOSIS — G4700 Insomnia, unspecified: Secondary | ICD-10-CM | POA: Diagnosis not present

## 2015-04-07 MED ORDER — VENLAFAXINE HCL ER 75 MG PO CP24: 150.0000 mg | ORAL_CAPSULE | Freq: Every day | ORAL | Status: DC

## 2015-04-07 MED ORDER — ZOLPIDEM TARTRATE 10 MG PO TABS: 10.0000 mg | ORAL_TABLET | Freq: Every evening | ORAL | Status: DC | PRN

## 2015-04-07 MED ORDER — SILDENAFIL CITRATE 100 MG PO TABS: 50.0000 mg | ORAL_TABLET | Freq: Every day | ORAL | Status: DC | PRN

## 2015-04-07 NOTE — Progress Notes (Signed)
Pre visit review using our clinic review tool, if applicable. No additional management support is needed unless otherwise documented below in the visit note. 

## 2015-04-07 NOTE — Assessment & Plan Note (Signed)
Request a refill on Viagra which is provided

## 2015-04-07 NOTE — Assessment & Plan Note (Signed)
Having some trouble sleeping mostly because he change his work shift and is doing nights. Today he reports that he feels that Ambien might  help better and likes to go back on it. Plan: Discontinue lorazepam, prescribe Ambien

## 2015-04-07 NOTE — Assessment & Plan Note (Signed)
At the last visit, he change from Celexa to Effexor, doing well, symptoms well-controlled, does not feel "passive" anymore. Plan: Refill Effexor, return  in 4 months

## 2015-04-07 NOTE — Progress Notes (Signed)
Subjective:    Patient ID: Keith West, male    DOB: May 22, 1971, 44 y.o.   MRN: [id number]  DOS:  04/07/2015 Type of visit - description : f/u Interval history: Patient change from celexa to Effexor, feeling well, anxiety well controlled. Insomnia, not well controlled, his job schedule change, is now doing night shift and having a difficult time adjusting Erectile dysfunction, needs a refill   Review of Systems Denies nausea, vomiting, diarrhea or constipation. No suicidal ideas  Past Medical History  Diagnosis Date  . GERD (gastroesophageal reflux disease)   . Heart murmur 1999    ECHO NI 2005-dr Magdelene Ruark had done  . ED (erectile dysfunction)   . Kidney stones 2000    Past Surgical History  Procedure Laterality Date  . Bunionectomy  1991    right  . Dental surgery    . Shoulder arthroscopy with subacromial decompression and bicep tendon repair Right 03/25/2013    Procedure: RIGHT SHOULDER ARTHROSCOPY WITH SUBACROMIAL DECOMPRESSION DISTAL CLAVICLE RESECTON LONG HEAD BICEP  TENDODESIS ;  Surgeon: Mable Paris, MD;  Location: [facility];  Service: Orthopedics;  Laterality: Right;    History   Social History  . Marital Status: Married    Spouse Name: N/A  . Number of Children: 3  . Years of Education: N/A   Occupational History  . electrician    Social History Main Topics  . Smoking status: Current Some Day Smoker  . Smokeless tobacco: Never Used     Comment: < 1 ppd used   . Alcohol Use: Yes     Comment: socially  . Drug Use: No  . Sexual Activity: Not on file   Other Topics Concern  . Not on file   Social History Narrative   Household-- pt, wife, 1 daughter and 1 G child   3 children, 4 Gkids        Medication List       This list is accurate as of: 04/07/15  6:35 PM.  Always use your most recent med list.               amLODipine 10 MG tablet  Commonly known as:  NORVASC  TAKE 1 TABLET EVERY DAY     ibuprofen 800 MG  tablet  Commonly known as:  ADVIL,MOTRIN  Take 1 tablet (800 mg total) by mouth every 8 (eight) hours as needed for moderate pain.     ranitidine 150 MG tablet  Commonly known as:  ZANTAC  Take 150 mg by mouth at bedtime. OTC     sildenafil 100 MG tablet  Commonly known as:  VIAGRA  Take 0.5-1 tablets (50-100 mg total) by mouth daily as needed for erectile dysfunction.     venlafaxine XR 75 MG 24 hr capsule  Commonly known as:  EFFEXOR XR  Take 2 capsules (150 mg total) by mouth daily with breakfast.     zolpidem 10 MG tablet  Commonly known as:  AMBIEN  Take 1 tablet (10 mg total) by mouth at bedtime as needed for sleep.           Objective:   Physical Exam BP 120/70 mmHg  Pulse 87  Temp(Src) 98.1 F (36.7 C) (Oral)  Ht  (1.803 m)  Wt 209 lb 8 oz (95.029 kg)  BMI 29.23 kg/m2  SpO2 97% General:   Well developed, well nourished . NAD.  HEENT:  Normocephalic . Face symmetric, atraumatic Neurologic:  alert & oriented  X3.  Speech normal, gait appropriate for age and unassisted Psych--  Cognition and judgment appear intact.  Cooperative with normal attention span and concentration.  Behavior appropriate. No anxious or depressed appearing.     Assessment & Plan:

## 2015-05-04 ENCOUNTER — Other Ambulatory Visit: Payer: Self-pay | Admitting: Internal Medicine

## 2015-07-17 ENCOUNTER — Other Ambulatory Visit: Payer: Self-pay | Admitting: Internal Medicine

## 2015-07-17 NOTE — Telephone Encounter (Signed)
Rx printed, awaiting MD signature.  

## 2015-07-17 NOTE — Telephone Encounter (Signed)
Okay #30 no refills 

## 2015-07-17 NOTE — Telephone Encounter (Signed)
Pt is requesting refill on Ambien. No longer on medication list.  Last OV: 04/07/2015 Last Fill: 09/26/2013 #30 4RF  Please advise.

## 2015-07-17 NOTE — Telephone Encounter (Signed)
Rx faxed to [facility] pharmacy.  

## 2015-08-07 ENCOUNTER — Ambulatory Visit: Payer: BLUE CROSS/BLUE SHIELD | Admitting: Internal Medicine

## 2015-08-10 ENCOUNTER — Ambulatory Visit (INDEPENDENT_AMBULATORY_CARE_PROVIDER_SITE_OTHER): Payer: BLUE CROSS/BLUE SHIELD | Admitting: Internal Medicine

## 2015-08-10 ENCOUNTER — Encounter: Payer: Self-pay | Admitting: Internal Medicine

## 2015-08-10 VITALS — BP 126/72 | HR 80 | Temp 98.1°F | Ht 71.0 in | Wt 202.2 lb

## 2015-08-10 DIAGNOSIS — F17200 Nicotine dependence, unspecified, uncomplicated: Secondary | ICD-10-CM

## 2015-08-10 DIAGNOSIS — F419 Anxiety disorder, unspecified: Secondary | ICD-10-CM | POA: Diagnosis not present

## 2015-08-10 DIAGNOSIS — I10 Essential (primary) hypertension: Secondary | ICD-10-CM

## 2015-08-10 DIAGNOSIS — Z09 Encounter for follow-up examination after completed treatment for conditions other than malignant neoplasm: Secondary | ICD-10-CM

## 2015-08-10 MED ORDER — VARENICLINE TARTRATE 0.5 MG X 11 & 1 MG X 42 PO MISC: ORAL | Status: DC

## 2015-08-10 MED ORDER — VARENICLINE TARTRATE 1 MG PO TABS: 1.0000 mg | ORAL_TABLET | Freq: Two times a day (BID) | ORAL | Status: DC

## 2015-08-10 NOTE — Patient Instructions (Signed)
  Start Chantix, 2 weeks later quit tobacco and don't use any nicotine supplements.  If you have any side effects please let me know.   Next visit  for a  single exam in 3 months, fasting. Please schedule an appointment at the front desk

## 2015-08-10 NOTE — Progress Notes (Signed)
Subjective:    Patient ID: Keith West, male    DOB: 04-Jun-1971, 44 y.o.   MRN: [id number]  DOS:  08/10/2015 Type of visit - description : Follow-up Interval history: Anxiety: Symptoms well-controlled with Effexor Tobacco abuse: Still smoking half pack a day; interested in quitting, medications?  Insomnia: Uses Ambien very seldom.  Review of Systems Denies chest pain or difficulty breathing. No nausea, vomiting, diarrhea  Past Medical History  Diagnosis Date  . GERD (gastroesophageal reflux disease)   . Heart murmur 1999    ECHO NI 2005-dr Shelly Shoultz had done  . ED (erectile dysfunction)   . Kidney stones 2000  . Anxiety 02/13/2014    Past Surgical History  Procedure Laterality Date  . Bunionectomy  1991    right  . Dental surgery    . Shoulder arthroscopy with subacromial decompression and bicep tendon repair Right 03/25/2013    Procedure: RIGHT SHOULDER ARTHROSCOPY WITH SUBACROMIAL DECOMPRESSION DISTAL CLAVICLE RESECTON LONG HEAD BICEP  TENDODESIS ;  Surgeon: Mable Paris, MD;  Location: [facility];  Service: Orthopedics;  Laterality: Right;    Social History   Social History  . Marital Status: Married    Spouse Name: N/A  . Number of Children: 3  . Years of Education: N/A   Occupational History  . electrician    Social History Main Topics  . Smoking status: Current Some Day Smoker  . Smokeless tobacco: Never Used     Comment: < 1 ppd used   . Alcohol Use: Yes     Comment: socially  . Drug Use: No  . Sexual Activity: Not on file   Other Topics Concern  . Not on file   Social History Narrative   Household-- pt, wife, 1 daughter and 1 G child   3 children, 4 Gkids        Medication List       This list is accurate as of: 08/10/15 11:59 PM.  Always use your most recent med list.               amLODipine 10 MG tablet  Commonly known as:  NORVASC  TAKE 1 TABLET EVERY DAY     ibuprofen 800 MG tablet  Commonly known as:   ADVIL,MOTRIN  Take 1 tablet (800 mg total) by mouth every 8 (eight) hours as needed for moderate pain.     ranitidine 150 MG tablet  Commonly known as:  ZANTAC  Take 150 mg by mouth at bedtime. OTC     sildenafil 100 MG tablet  Commonly known as:  VIAGRA  Take 0.5-1 tablets (50-100 mg total) by mouth daily as needed for erectile dysfunction.     varenicline 0.5 MG X 11 & 1 MG X 42 tablet  Commonly known as:  CHANTIX STARTING MONTH PAK  Take one 0.5 mg tablet by mouth once daily for 3 days, then increase to one 0.5 mg tablet twice daily for 4 days, then increase to one 1 mg tablet twice daily.     varenicline 1 MG tablet  Commonly known as:  CHANTIX  Take 1 tablet (1 mg total) by mouth 2 (two) times daily.     venlafaxine XR 75 MG 24 hr capsule  Commonly known as:  EFFEXOR XR  Take 2 capsules (150 mg total) by mouth daily with breakfast.     zolpidem 10 MG tablet  Commonly known as:  AMBIEN  Take 1 tablet (10 mg total) by mouth  at bedtime as needed for sleep.           Objective:   Physical Exam BP 126/72 mmHg  Pulse 80  Temp(Src) 98.1 F (36.7 C) (Oral)  Ht  (1.803 m)  Wt 202 lb 4 oz (91.74 kg)  BMI 28.22 kg/m2  SpO2 98% General:   Well developed, well nourished . NAD.  HEENT:  Normocephalic . Face symmetric, atraumatic Lungs:  CTA B Normal respiratory effort, no intercostal retractions, no accessory muscle use. Heart: RRR,  no murmur.  No pretibial edema bilaterally  Skin: Not pale. Not jaundice Neurologic:  alert & oriented X3.  Speech normal, gait appropriate for age and unassisted Psych--  Cognition and judgment appear intact.  Cooperative with normal attention span and concentration.  Behavior appropriate. No anxious or depressed appearing.      Assessment & Plan:   Assessment> HTN Anxiety  Insomnia: Ambien as needed  GERD Heart murmur, echo normal 2005 Erectile dysfunction Urolithiasis  Plan Anxiety: Well controlled with current  medications. Continue Effexor Tobacco use: Likes to get help, Chantix? I agree, he tried it before without side effects, no interaction with Effexor noted. We discussed how to take Chantix and to quit tobacco 2 weeks afterwards. HTN: Well-controlled. No change Primary care: Declined a flu shot RTC 3 months

## 2015-08-10 NOTE — Progress Notes (Signed)
Pre visit review using our clinic review tool, if applicable. No additional management support is needed unless otherwise documented below in the visit note. 

## 2015-08-11 DIAGNOSIS — Z09 Encounter for follow-up examination after completed treatment for conditions other than malignant neoplasm: Secondary | ICD-10-CM | POA: Insufficient documentation

## 2015-08-11 NOTE — Assessment & Plan Note (Signed)
Anxiety: Well controlled with current medications. Continue Effexor Tobacco use: Likes to get help, Chantix? I agree, he tried it before without side effects, no interaction with Effexor noted. We discussed how to take Chantix and to quit tobacco 2 weeks afterwards. HTN: Well-controlled. No change Primary care: Declined a flu shot RTC 3 months

## 2015-09-09 ENCOUNTER — Other Ambulatory Visit: Payer: Self-pay | Admitting: Internal Medicine

## 2015-11-18 ENCOUNTER — Encounter: Payer: BLUE CROSS/BLUE SHIELD | Admitting: Internal Medicine

## 2015-11-24 ENCOUNTER — Ambulatory Visit (INDEPENDENT_AMBULATORY_CARE_PROVIDER_SITE_OTHER): Payer: BLUE CROSS/BLUE SHIELD | Admitting: Internal Medicine

## 2015-11-24 ENCOUNTER — Encounter: Payer: Self-pay | Admitting: Internal Medicine

## 2015-11-24 VITALS — BP 116/74 | HR 50 | Temp 98.1°F | Ht 71.0 in | Wt 203.2 lb

## 2015-11-24 DIAGNOSIS — Z0000 Encounter for general adult medical examination without abnormal findings: Secondary | ICD-10-CM

## 2015-11-24 DIAGNOSIS — Z114 Encounter for screening for human immunodeficiency virus [HIV]: Secondary | ICD-10-CM

## 2015-11-24 LAB — HIV ANTIBODY (ROUTINE TESTING W REFLEX): HIV 1&2 Ab, 4th Generation: NONREACTIVE

## 2015-11-24 LAB — LIPID PANEL
CHOL/HDL RATIO: 3
CHOLESTEROL: 205 mg/dL — AB (ref 0–200)
HDL: 63 mg/dL (ref >39.00)
LDL CALC: 126 mg/dL — AB (ref 0–99)
NonHDL: 142.3
TRIGLYCERIDES: 84 mg/dL (ref 0.0–149.0)
VLDL: 16.8 mg/dL (ref 0.0–40.0)

## 2015-11-24 LAB — COMPREHENSIVE METABOLIC PANEL
ALT: 58 U/L — ABNORMAL HIGH (ref 0–53)
AST: 45 U/L — AB (ref 0–37)
Albumin: 4.5 g/dL (ref 3.5–5.2)
Alkaline Phosphatase: 108 U/L (ref 39–117)
BUN: 10 mg/dL (ref 6–23)
CALCIUM: 9.3 mg/dL (ref 8.4–10.5)
CHLORIDE: 104 meq/L (ref 96–112)
CO2: 27 meq/L (ref 19–32)
CREATININE: 0.9 mg/dL (ref 0.40–1.50)
GFR: 116.57 mL/min (ref >60.00)
Glucose, Bld: 87 mg/dL (ref 70–99)
POTASSIUM: 3.8 meq/L (ref 3.5–5.1)
SODIUM: 139 meq/L (ref 135–145)
Total Bilirubin: 1.3 mg/dL — ABNORMAL HIGH (ref 0.2–1.2)
Total Protein: 8.3 g/dL (ref 6.0–8.3)

## 2015-11-24 LAB — FOLATE: Folate: 12.6 ng/mL (ref >5.9)

## 2015-11-24 LAB — VITAMIN B12: Vitamin B-12: 481 pg/mL (ref 211–911)

## 2015-11-24 NOTE — Progress Notes (Signed)
Pre visit review using our clinic review tool, if applicable. No additional management support is needed unless otherwise documented below in the visit note. 

## 2015-11-24 NOTE — Patient Instructions (Signed)
BEFORE YOU LEAVE THE OFFICE:  GO TO THE LAB  Get the blood work    GO TO THE FRONT DESK Schedule a complete physical exam to be done in 1 year Please be fasting   

## 2015-11-24 NOTE — Progress Notes (Signed)
Subjective:    Patient ID: Keith West, male    DOB: [birth year], 45 y.o.   MRN: [id number]  DOS:  11/24/2015 Type of visit - description : CPX Interval history: Taking Chantix, has decreased tobacco use significantly.    Review of Systems  Constitutional: No fever. No chills. No unexplained wt changes. No unusual sweats  HEENT: No dental problems, no ear discharge, no facial swelling, no voice changes. No eye discharge, no eye  redness , no  intolerance to light   Respiratory: No wheezing , no  difficulty breathing. No cough , no mucus production  Cardiovascular:  sporadically has a sharp, mid anterior chest pain, at rest with no radiation. Last 1 minute, not exertional, episodes every 3-4 months., no leg swelling , no  Palpitations  GI: no nausea, no vomiting, no diarrhea , no  abdominal pain.  No blood in the stools. No dysphagia, no odynophagia    Endocrine: No polyphagia, no polyuria , no polydipsia  GU: No dysuria, gross hematuria, difficulty urinating. No urinary urgency, no frequency.  Musculoskeletal: No joint swellings or unusual aches or pains  Skin: No change in the color of the skin, palor , no  Rash  Allergic, immunologic: No environmental allergies , no  food allergies  Neurological: No dizziness no  syncope. No headaches. No diplopia, no slurred, no slurred speech, no motor deficits, no facial  Numbness Occasionally has numbness from the elbows down when he laid down in bed watching his cell phone. Sometimes wakes up with either the left or right arm numbness, mostly when he sleeps on his side. Denies neck pain or persistent paresthesias. Hematological: No enlarged lymph nodes, no easy bruising , no unusual bleedings  Psychiatry: No suicidal ideas, no hallucinations, no beavior problems, no confusion.  No unusual/severe anxiety, no depression  Past Medical History  Diagnosis Date  . GERD (gastroesophageal reflux disease)   . Heart murmur 1999    ECHO  NI 2005-dr Zeki Bedrosian had done  . ED (erectile dysfunction)   . Kidney stones 2000  . Anxiety 02/13/2014    Past Surgical History  Procedure Laterality Date  . Bunionectomy  1991    right  . Dental surgery    . Shoulder arthroscopy with subacromial decompression and bicep tendon repair Right 03/25/2013    Procedure: RIGHT SHOULDER ARTHROSCOPY WITH SUBACROMIAL DECOMPRESSION DISTAL CLAVICLE RESECTON LONG HEAD BICEP  TENDODESIS ;  Surgeon: Mable Paris, MD;  [facility];  Service: Orthopedics;  Laterality: Right;    Social History   Social History  . Marital Status: Married    Spouse Name: N/A  . Number of Children: 3  . Years of Education: N/A   Occupational History  . electrician    Social History Main Topics  . Smoking status: Current Some Day Smoker  . Smokeless tobacco: Never Used     Comment: < 1 ppd used   . Alcohol Use: Yes     Comment: socially  . Drug Use: No  . Sexual Activity: Not on file   Other Topics Concern  . Not on file   Social History Narrative   Household-- pt, wife, 1 daughter and 1 G child   3 children, 4 Gkids     Family History  Problem Relation Age of Onset  . Coronary artery disease Neg Hx   . Colon cancer Neg Hx   . Prostate cancer Neg Hx   . Hypertension Mother     F  and M  . Diabetes Other     M&F family  . Stroke Paternal Aunt   . Dementia Maternal Grandmother   . Alcohol abuse Father   . Lung cancer Father   . Breast cancer Sister   . CAD Neg Hx        Medication List       This list is accurate as of: 11/24/15 11:59 PM.  Always use your most recent med list.               amLODipine 10 MG tablet  Commonly known as:  NORVASC  Take 1 tablet (10 mg total) by mouth daily.     ibuprofen 800 MG tablet  Commonly known as:  ADVIL,MOTRIN  Take 1 tablet (800 mg total) by mouth every 8 (eight) hours as needed for moderate pain.     ranitidine 150 MG tablet  Commonly known as:  ZANTAC  Take 150  mg by mouth at bedtime. OTC     sildenafil 100 MG tablet  Commonly known as:  VIAGRA  Take 0.5-1 tablets (50-100 mg total) by mouth daily as needed for erectile dysfunction.     varenicline 0.5 MG X 11 & 1 MG X 42 tablet  Commonly known as:  CHANTIX STARTING MONTH PAK  Take one 0.5 mg tablet by mouth once daily for 3 days, then increase to one 0.5 mg tablet twice daily for 4 days, then increase to one 1 mg tablet twice daily.     varenicline 1 MG tablet  Commonly known as:  CHANTIX  Take 1 tablet (1 mg total) by mouth 2 (two) times daily.     venlafaxine XR 75 MG 24 hr capsule  Commonly known as:  EFFEXOR XR  Take 2 capsules (150 mg total) by mouth daily with breakfast.     zolpidem 10 MG tablet  Commonly known as:  AMBIEN  Take 1 tablet (10 mg total) by mouth at bedtime as needed for sleep.           Objective:   Physical Exam BP 116/74 mmHg  Pulse 50  Temp(Src) 98.1 F (36.7 C) (Oral)  Ht  (1.803 m)  Wt 203 lb 4 oz (92.194 kg)  BMI 28.36 kg/m2  SpO2 99% General:   Well developed, well nourished . NAD.  HEENT:  Normocephalic . Face symmetric, atraumatic. Neck: No TTP, range of motion normal Lungs:  CTA B Normal respiratory effort, no intercostal retractions, no accessory muscle use. Heart: RRR,  no murmur.  no pretibial edema bilaterally  Abdomen:  Not distended, soft, non-tender. No rebound or rigidity. No mass,organomegaly Skin: Not pale. Not jaundice Neurologic:  alert & oriented X3.  Speech normal, gait appropriate for age and unassisted. Motor and DTRs symmetric Psych--  Cognition and judgment appear intact.  Cooperative with normal attention span and concentration.  Behavior appropriate. No anxious or depressed appearing.    Assessment & Plan:   Assessment> HTN Anxiety  Insomnia: Ambien as needed  GERD Heart murmur, echo normal 2005 Erectile dysfunction Urolithiasis  Plan  Tobacco abuse: On Chantix, has decreased tobacco 2-3  cigarettes a day, encouraged to completely quit. Atypical chest pain: EKG today no acute changes, similar to previous EKG. Recommend observation Paresthesias: For now I recommend avoidance of positions that trigger paresthesias. Check B12 ,folic acid. If sx persistently, pt will call RTC one year

## 2015-11-24 NOTE — Assessment & Plan Note (Addendum)
Td 07 Flu shot declined  Diet-exercise -discussed    prostate cancer screening --> DRE negative 2015, PSA 2 within normal. Screening next year, patient is African-American Labs   : CMP, FLP, HIV, B12, folic acid

## 2015-11-25 ENCOUNTER — Other Ambulatory Visit: Payer: Self-pay | Admitting: Internal Medicine

## 2015-11-25 NOTE — Telephone Encounter (Signed)
Ok 30 and 2 RF 

## 2015-11-25 NOTE — Telephone Encounter (Signed)
Pt is requesting refill on Ambien.  Last OV: 11/24/2015 Last Fill: 07/17/2015 #30 and 0RF UDS: Not needed  Please advise.

## 2015-11-25 NOTE — Telephone Encounter (Signed)
Rx faxed to [facility] pharmacy.  

## 2015-11-25 NOTE — Telephone Encounter (Signed)
Rx printed, awaiting MD signature.  

## 2015-11-30 ENCOUNTER — Telehealth: Payer: Self-pay

## 2015-11-30 NOTE — Telephone Encounter (Signed)
UDS:11/24/2015  Negative for Ambien   Per Dr. Drue Novel not needed if Pt is only on Ambien.   Low risk 11/30/2015

## 2015-12-17 ENCOUNTER — Encounter: Payer: Self-pay | Admitting: Internal Medicine

## 2016-01-10 ENCOUNTER — Telehealth: Payer: Self-pay | Admitting: Internal Medicine

## 2016-01-10 DIAGNOSIS — R7989 Other specified abnormal findings of blood chemistry: Secondary | ICD-10-CM

## 2016-01-10 DIAGNOSIS — R945 Abnormal results of liver function studies: Principal | ICD-10-CM

## 2016-01-10 NOTE — Telephone Encounter (Signed)
Please call the patient and set up a lab appointment. Then forward this message to the lab, we need to get a Rainbow in case he needs additional testing.

## 2016-01-11 ENCOUNTER — Other Ambulatory Visit (INDEPENDENT_AMBULATORY_CARE_PROVIDER_SITE_OTHER): Payer: BLUE CROSS/BLUE SHIELD

## 2016-01-11 DIAGNOSIS — R945 Abnormal results of liver function studies: Principal | ICD-10-CM

## 2016-01-11 DIAGNOSIS — R7989 Other specified abnormal findings of blood chemistry: Secondary | ICD-10-CM | POA: Diagnosis not present

## 2016-01-11 LAB — HEPATIC FUNCTION PANEL
ALK PHOS: 103 U/L (ref 39–117)
ALT: 53 U/L (ref 0–53)
AST: 41 U/L — AB (ref 0–37)
Albumin: 4.3 g/dL (ref 3.5–5.2)
BILIRUBIN DIRECT: 0.1 mg/dL (ref 0.0–0.3)
BILIRUBIN TOTAL: 0.4 mg/dL (ref 0.2–1.2)
Total Protein: 7.6 g/dL (ref 6.0–8.3)

## 2016-01-11 NOTE — Telephone Encounter (Signed)
Pt is scheduled to come in at 8:45am today. Please see Dr. Leta JunglingPaz's note below.

## 2016-04-28 ENCOUNTER — Other Ambulatory Visit: Payer: Self-pay | Admitting: Internal Medicine

## 2016-04-28 NOTE — Telephone Encounter (Signed)
Pt is requesting refill on Viagra 100mg .  Last OV: 11/24/2015 Last Fill: 04/07/2015 #10 and 3RF   Okay to refill?

## 2016-04-28 NOTE — Telephone Encounter (Signed)
Rx sent 

## 2016-04-28 NOTE — Telephone Encounter (Signed)
Ok 10 and 3 RF 

## 2016-05-18 ENCOUNTER — Other Ambulatory Visit: Payer: Self-pay | Admitting: Internal Medicine

## 2016-05-19 NOTE — Telephone Encounter (Signed)
Rx faxed to [facility] pharmacy.  

## 2016-05-19 NOTE — Telephone Encounter (Signed)
Rx printed, awaiting MD signature.  

## 2016-05-19 NOTE — Telephone Encounter (Signed)
Ok 30 and 2 RF 

## 2016-05-19 NOTE — Telephone Encounter (Signed)
Pt is requesting refill on Ambien.  Last OV: 11/24/2015 Last Fill: 11/25/2015 #30 and 2RF UDS: Not needed for Ambien  Please advise.

## 2016-09-25 ENCOUNTER — Other Ambulatory Visit: Payer: Self-pay | Admitting: Internal Medicine

## 2016-11-24 ENCOUNTER — Encounter: Payer: BLUE CROSS/BLUE SHIELD | Admitting: Internal Medicine

## 2016-12-08 ENCOUNTER — Encounter: Payer: Self-pay | Admitting: Internal Medicine

## 2016-12-08 ENCOUNTER — Ambulatory Visit (INDEPENDENT_AMBULATORY_CARE_PROVIDER_SITE_OTHER): Payer: BLUE CROSS/BLUE SHIELD | Admitting: Internal Medicine

## 2016-12-08 VITALS — BP 122/72 | HR 87 | Temp 98.0°F | Resp 12 | Ht 71.0 in | Wt 204.5 lb

## 2016-12-08 DIAGNOSIS — R7989 Other specified abnormal findings of blood chemistry: Secondary | ICD-10-CM

## 2016-12-08 DIAGNOSIS — R945 Abnormal results of liver function studies: Secondary | ICD-10-CM

## 2016-12-08 DIAGNOSIS — Z0000 Encounter for general adult medical examination without abnormal findings: Secondary | ICD-10-CM | POA: Diagnosis not present

## 2016-12-08 DIAGNOSIS — Z23 Encounter for immunization: Secondary | ICD-10-CM | POA: Diagnosis not present

## 2016-12-08 LAB — BASIC METABOLIC PANEL
BUN: 9 mg/dL (ref 6–23)
CHLORIDE: 106 meq/L (ref 96–112)
CO2: 23 meq/L (ref 19–32)
Calcium: 9.5 mg/dL (ref 8.4–10.5)
Creatinine, Ser: 0.88 mg/dL (ref 0.40–1.50)
GFR: 119.1 mL/min (ref >60.00)
GLUCOSE: 80 mg/dL (ref 70–99)
POTASSIUM: 4 meq/L (ref 3.5–5.1)
SODIUM: 139 meq/L (ref 135–145)

## 2016-12-08 LAB — CBC WITH DIFFERENTIAL/PLATELET
BASOS ABS: 0.1 10*3/uL (ref 0.0–0.1)
BASOS PCT: 1.5 % (ref 0.0–3.0)
EOS ABS: 0.1 10*3/uL (ref 0.0–0.7)
Eosinophils Relative: 1.3 % (ref 0.0–5.0)
HCT: 47 % (ref 39.0–52.0)
Hemoglobin: 16.3 g/dL (ref 13.0–17.0)
Lymphocytes Relative: 34.6 % (ref 12.0–46.0)
Lymphs Abs: 1.9 10*3/uL (ref 0.7–4.0)
MCHC: 34.7 g/dL (ref 30.0–36.0)
MCV: 92.8 fl (ref 78.0–100.0)
MONO ABS: 0.4 10*3/uL (ref 0.1–1.0)
Monocytes Relative: 6.9 % (ref 3.0–12.0)
NEUTROS ABS: 3.1 10*3/uL (ref 1.4–7.7)
Neutrophils Relative %: 55.7 % (ref 43.0–77.0)
PLATELETS: 341 10*3/uL (ref 150.0–400.0)
RBC: 5.07 Mil/uL (ref 4.22–5.81)
RDW: 13 % (ref 11.5–15.5)
WBC: 5.5 10*3/uL (ref 4.0–10.5)

## 2016-12-08 LAB — HEPATITIS B CORE ANTIBODY, TOTAL: HEP B C TOTAL AB: NONREACTIVE

## 2016-12-08 LAB — LIPID PANEL
CHOL/HDL RATIO: 3
CHOLESTEROL: 170 mg/dL (ref 0–200)
HDL: 55.1 mg/dL (ref >39.00)
LDL CALC: 94 mg/dL (ref 0–99)
NonHDL: 114.62
TRIGLYCERIDES: 102 mg/dL (ref 0.0–149.0)
VLDL: 20.4 mg/dL (ref 0.0–40.0)

## 2016-12-08 LAB — HEPATITIS C ANTIBODY: HCV AB: NEGATIVE

## 2016-12-08 LAB — PSA: PSA: 3.25 ng/mL (ref 0.10–4.00)

## 2016-12-08 LAB — HEPATITIS B SURFACE ANTIGEN: Hepatitis B Surface Ag: NEGATIVE

## 2016-12-08 LAB — AST: AST: 29 U/L (ref 0–37)

## 2016-12-08 LAB — ALT: ALT: 38 U/L (ref 0–53)

## 2016-12-08 NOTE — Assessment & Plan Note (Addendum)
Td 1-18;  Flu shot declined     prostate cancer screening --> DRE negative today, check a  PSA  Labs: BMP, AST, ALT, FLP, CBC, PSA Diet and exercise discussed Tobacco abuse: Has Chantix but has not used it yet, pt is  trying to minimize tobacco abuse, going through a very difficult time due to recent death of his child.

## 2016-12-08 NOTE — Assessment & Plan Note (Signed)
HTN: On amlodipine, BP seems to be well. Anxiety, insomnia: Ambien as needed, not taking Effexor, despite many challenges (lost a child to cerebral palsy) he is doing okay emotionally. Encouraged to reach for  help if needed Increase LFTs: Check hepatitis serologies (s/p heb shots x 2 ) RTC one year

## 2016-12-08 NOTE — Progress Notes (Signed)
Subjective:    Patient ID: Keith West, male    DOB: Jul 14, 1971, 46 y.o.   MRN: [id number]  DOS:  12/08/2016 Type of visit - description : CPX Interval history:  In addition to CPX we discussed other issues, see review of systems   Review of Systems  Constitutional: No fever. No chills. No unexplained wt changes. No unusual sweats  HEENT: No dental problems, no ear discharge, no facial swelling, no voice changes. No eye discharge, no eye  redness , no  intolerance to light   Respiratory: No wheezing , no  difficulty breathing. No cough , no mucus production  Cardiovascular: No CP, no leg swelling , no  Palpitations  GI:  Occasional GERD symptoms no nausea, no vomiting, no diarrhea , no  abdominal pain.  No blood in the stools. No dysphagia, no odynophagia    Endocrine: No polyphagia, no polyuria , no polydipsia  GU: No dysuria, gross hematuria, difficulty urinating. No urinary urgency, no frequency.  Musculoskeletal: No joint swellings or unusual aches or pains  Skin: No change in the color of the skin, palor , no  Rash  Allergic, immunologic: No environmental allergies , no  food allergies  Neurological: No dizziness no  syncope. No headaches. No diplopia, no slurred, no slurred speech, no motor deficits, no facial  Numbness  Hematological: No enlarged lymph nodes, no easy bruising , no unusual bleedings  Psychiatry: No suicidal ideas, no hallucinations, no beavior problems, no confusion.  Unfortunately he lost a child, he had cerebral palsy. Also mother and sister are no doing well healthwise. Despite diet he is doing okay emotionally for the circumstances. No suicidal ideas.  Past Medical History:  Diagnosis Date  . Anxiety 02/13/2014  . ED (erectile dysfunction)   . GERD (gastroesophageal reflux disease)   . Heart murmur 1999   ECHO NI 2005-dr Eavan Gonterman had done  . Kidney stones 2000    Past Surgical History:  Procedure Laterality Date  . BUNIONECTOMY  1991   right  . DENTAL SURGERY    . SHOULDER ARTHROSCOPY WITH SUBACROMIAL DECOMPRESSION AND BICEP TENDON REPAIR Right 03/25/2013   Procedure: RIGHT SHOULDER ARTHROSCOPY WITH SUBACROMIAL DECOMPRESSION DISTAL CLAVICLE RESECTON LONG HEAD BICEP  TENDODESIS ;  Surgeon: Mable Paris, MD;  Location: [facility];  Service: Orthopedics;  Laterality: Right;    Social History   Social History  . Marital status: Married    Spouse name: N/A  . Number of children: 3  . Years of education: N/A   Occupational History  . electrician    Social History Main Topics  . Smoking status: Current Some Day Smoker  . Smokeless tobacco: Never Used     Comment: < 1/2  ppd used   . Alcohol use Yes     Comment: socially  . Drug use: No  . Sexual activity: Not on file   Other Topics Concern  . Not on file   Social History Narrative   Household-- pt, wife, 1 daughter and 1 G child   3 children, 4 Gkids   Lost 89 y/o child (cerebral palsy) ~ 11-2016     Family History  Problem Relation Age of Onset  . Hypertension Mother     F and M  . Diabetes Other     M&F family  . Stroke Paternal Aunt   . Dementia Maternal Grandmother   . Alcohol abuse Father   . Lung cancer Father   . Breast cancer Sister   .  Coronary artery disease Neg Hx   . Colon cancer Neg Hx   . Prostate cancer Neg Hx   . CAD Neg Hx     Allergies as of 12/08/2016      Reactions   Hydrocodone-acetaminophen Nausea And Vomiting      Medication List       Accurate as of 12/08/16  5:26 PM. Always use your most recent med list.          amLODipine 10 MG tablet Commonly known as:  NORVASC Take 1 tablet (10 mg total) by mouth daily.   ibuprofen 800 MG tablet Commonly known as:  ADVIL,MOTRIN Take 1 tablet (800 mg total) by mouth every 8 (eight) hours as needed for moderate pain.   ranitidine 150 MG tablet Commonly known as:  ZANTAC Take 150 mg by mouth at bedtime. OTC   sildenafil 100 MG tablet Commonly known  as:  VIAGRA Take 0.5-1 tablets (50-100 mg total) by mouth daily as needed for erectile dysfunction.   zolpidem 10 MG tablet Commonly known as:  AMBIEN Take 1 tablet (10 mg total) by mouth at bedtime as needed for sleep.          Objective:   Physical Exam BP 122/72 (BP Location: Left Arm, Patient Position: Sitting, Cuff Size: Normal)   Pulse 87   Temp 98 F (36.7 C) (Oral)   Resp 12   Ht 5\' 11"  (1.803 m)   Wt 204 lb 8 oz (92.8 kg)   SpO2 97%   BMI 28.52 kg/m   General:   Well developed, well nourished . NAD.  Neck: No  thyromegaly  HEENT:  Normocephalic . Face symmetric, atraumatic Lungs:  CTA B Normal respiratory effort, no intercostal retractions, no accessory muscle use. Heart: RRR,  no murmur.  No pretibial edema bilaterally  Abdomen:  Not distended, soft, non-tender. No rebound or rigidity.   Skin: Exposed areas without rash. Not pale. Not jaundice Rectal:  External abnormalities: none. Normal sphincter tone. No rectal masses or tenderness.  No stools found Prostate: Prostate gland firm and smooth, no enlargement, nodularity, tenderness, mass, asymmetry or induration.  Neurologic:  alert & oriented X3.  Speech normal, gait appropriate for age and unassisted Strength symmetric and appropriate for age.  Psych: Cognition and judgment appear intact.  Cooperative with normal attention span and concentration.  Behavior appropriate. No anxious or depressed appearing.    Assessment & Plan:   Assessment> HTN Anxiety  Insomnia: Ambien as needed  GERD Heart murmur, echo normal 2005 Erectile dysfunction Urolithiasis Mildly elevated LFTs no w/u as off   PLAN: HTN: On amlodipine, BP seems to be well. Anxiety, insomnia: Ambien as needed, not taking Effexor, despite many challenges (lost a child to cerebral palsy) he is doing okay emotionally. Encouraged to reach for  help if needed Increase LFTs: Check hepatitis serologies (s/p heb shots x 2 ) RTC one year

## 2016-12-08 NOTE — Patient Instructions (Addendum)
GO TO THE LAB : Get the blood work     GO TO THE FRONT DESK Schedule your next appointment for a  physical exam in one year  

## 2016-12-08 NOTE — Progress Notes (Signed)
Pre visit review using our clinic review tool, if applicable. No additional management support is needed unless otherwise documented below in the visit note. 

## 2016-12-19 ENCOUNTER — Other Ambulatory Visit: Payer: Self-pay | Admitting: Internal Medicine

## 2016-12-19 NOTE — Telephone Encounter (Signed)
Rx faxed to [facility] pharmacy.  

## 2016-12-19 NOTE — Telephone Encounter (Signed)
Rx printed, awaiting MD signature.  

## 2016-12-19 NOTE — Telephone Encounter (Signed)
Pt is requesting refill on Ambien.  Last OV: 12/08/2016 Last Fill: 05/19/2016 #30 and 2RF UDS: Not needed for Ambien   Please advise.

## 2016-12-19 NOTE — Telephone Encounter (Signed)
Ok 30 and 5 RF 

## 2017-02-22 ENCOUNTER — Other Ambulatory Visit: Payer: Self-pay | Admitting: Internal Medicine

## 2017-08-17 ENCOUNTER — Other Ambulatory Visit: Payer: Self-pay | Admitting: Internal Medicine

## 2017-08-17 NOTE — Telephone Encounter (Signed)
Pt is requesting refill on Ambien .  Last OV: 12/08/2016 Last Fill: 12/19/2016 #30 and 5RF UDS: Not needed for Ambien per PCP   NCCR printed, no issues noted  Please advise.

## 2017-08-17 NOTE — Telephone Encounter (Signed)
Rx faxed to [facility] pharmacy.  

## 2017-08-17 NOTE — Telephone Encounter (Signed)
Rx printed, awaiting MD signature.  

## 2017-08-17 NOTE — Telephone Encounter (Signed)
Okay Ambien No. 30 and 5 refills 

## 2017-12-11 ENCOUNTER — Encounter: Payer: Self-pay | Admitting: Internal Medicine

## 2017-12-11 ENCOUNTER — Ambulatory Visit (INDEPENDENT_AMBULATORY_CARE_PROVIDER_SITE_OTHER): Payer: BLUE CROSS/BLUE SHIELD | Admitting: Internal Medicine

## 2017-12-11 VITALS — BP 124/68 | HR 87 | Temp 98.2°F | Resp 14 | Ht 71.0 in | Wt 206.4 lb

## 2017-12-11 DIAGNOSIS — G4700 Insomnia, unspecified: Secondary | ICD-10-CM

## 2017-12-11 DIAGNOSIS — I10 Essential (primary) hypertension: Secondary | ICD-10-CM | POA: Diagnosis not present

## 2017-12-11 DIAGNOSIS — B353 Tinea pedis: Secondary | ICD-10-CM | POA: Diagnosis not present

## 2017-12-11 DIAGNOSIS — E010 Iodine-deficiency related diffuse (endemic) goiter: Secondary | ICD-10-CM | POA: Diagnosis not present

## 2017-12-11 DIAGNOSIS — F419 Anxiety disorder, unspecified: Secondary | ICD-10-CM | POA: Diagnosis not present

## 2017-12-11 DIAGNOSIS — Z0001 Encounter for general adult medical examination with abnormal findings: Secondary | ICD-10-CM | POA: Diagnosis not present

## 2017-12-11 DIAGNOSIS — Z0000 Encounter for general adult medical examination without abnormal findings: Secondary | ICD-10-CM

## 2017-12-11 LAB — LIPID PANEL
CHOL/HDL RATIO: 3
Cholesterol: 170 mg/dL (ref 0–200)
HDL: 63.8 mg/dL (ref >39.00)
LDL CALC: 82 mg/dL (ref 0–99)
NonHDL: 106.69
Triglycerides: 124 mg/dL (ref 0.0–149.0)
VLDL: 24.8 mg/dL (ref 0.0–40.0)

## 2017-12-11 LAB — COMPREHENSIVE METABOLIC PANEL
ALT: 39 U/L (ref 0–53)
AST: 33 U/L (ref 0–37)
Albumin: 4.4 g/dL (ref 3.5–5.2)
Alkaline Phosphatase: 111 U/L (ref 39–117)
BILIRUBIN TOTAL: 0.6 mg/dL (ref 0.2–1.2)
BUN: 13 mg/dL (ref 6–23)
CHLORIDE: 104 meq/L (ref 96–112)
CO2: 25 meq/L (ref 19–32)
CREATININE: 0.93 mg/dL (ref 0.40–1.50)
Calcium: 9.1 mg/dL (ref 8.4–10.5)
GFR: 111.26 mL/min (ref >60.00)
Glucose, Bld: 85 mg/dL (ref 70–99)
Potassium: 4.1 mEq/L (ref 3.5–5.1)
SODIUM: 138 meq/L (ref 135–145)
Total Protein: 7.7 g/dL (ref 6.0–8.3)

## 2017-12-11 LAB — PSA: PSA: 2.81 ng/mL (ref 0.10–4.00)

## 2017-12-11 LAB — TSH: TSH: 0.94 u[IU]/mL (ref 0.35–4.50)

## 2017-12-11 MED ORDER — KETOCONAZOLE 2 % EX CREA: 1.0000 "application " | TOPICAL_CREAM | Freq: Every day | CUTANEOUS | 0 refills | Status: DC

## 2017-12-11 MED ORDER — AMLODIPINE BESYLATE 10 MG PO TABS: 10.0000 mg | ORAL_TABLET | Freq: Every day | ORAL | 3 refills | Status: DC

## 2017-12-11 NOTE — Progress Notes (Signed)
Subjective:    Patient ID: Keith SpiresZavier L West, male    DOB: [birth year], 47 y.o.   MRN: [id number]  DOS:  12/11/2017 Type of visit - description : cpx Interval history:  No major concerns   Review of Systems  Experienced some mild left wrist and right knee pain in the last couple of days.  Better today Thinks he has athletes food, not responding to OTCs. Emotionally doing okay  Other than above, a 14 point review of systems is negative     Past Medical History:  Diagnosis Date  . Anxiety 02/13/2014  . ED (erectile dysfunction)   . GERD (gastroesophageal reflux disease)   . Heart murmur 1999   ECHO NI 2005-dr Kailiana Granquist had done  . Kidney stones 2000    Past Surgical History:  Procedure Laterality Date  . BUNIONECTOMY  1991   right  . DENTAL SURGERY    . SHOULDER ARTHROSCOPY WITH SUBACROMIAL DECOMPRESSION AND BICEP TENDON REPAIR Right 03/25/2013   Procedure: RIGHT SHOULDER ARTHROSCOPY WITH SUBACROMIAL DECOMPRESSION DISTAL CLAVICLE RESECTON LONG HEAD BICEP  TENDODESIS ;  Surgeon: Mable ParisJustin William Chandler, MD;  Location: [facility];  Service: Orthopedics;  Laterality: Right;    Social History   Socioeconomic History  . Marital status: Married    Spouse name: Not on file  . Number of children: 3  . Years of education: Not on file  . Highest education level: Not on file  Social Needs  . Financial resource strain: Not on file  . Food insecurity - worry: Not on file  . Food insecurity - inability: Not on file  . Transportation needs - medical: Not on file  . Transportation needs - non-medical: Not on file  Occupational History  . Occupation: Personnel officerelectrician  Tobacco Use  . Smoking status: Current Some Day Smoker  . Smokeless tobacco: Never Used  . Tobacco comment: ~ 1/4   ppd used   Substance and Sexual Activity  . Alcohol use: Yes    Comment: socially  . Drug use: No  . Sexual activity: Not on file  Other Topics Concern  . Not on file  Social History Narrative   Household-- pt, wife, 1 daughter     3 children, 5 Gkids   Lost 49 y/o child (cerebral palsy) ~ 11-2016     Family History  Problem Relation Age of Onset  . Hypertension Mother        F and M  . Diabetes Other        M&F family  . Stroke Paternal Aunt   . Dementia Maternal Grandmother   . Alcohol abuse Father   . Lung cancer Father   . Breast cancer Sister   . Coronary artery disease Neg Hx   . Colon cancer Neg Hx   . Prostate cancer Neg Hx   . CAD Neg Hx      Allergies as of 12/11/2017      Reactions   Hydrocodone-acetaminophen Nausea And Vomiting      Medication List        Accurate as of 12/11/17 12:47 PM. Always use your most recent med list.          amLODipine 10 MG tablet Commonly known as:  NORVASC Take 1 tablet (10 mg total) by mouth daily.   ketoconazole 2 % cream Commonly known as:  NIZORAL Apply 1 application topically daily.   ranitidine 150 MG tablet Commonly known as:  ZANTAC Take 150 mg by mouth at  bedtime. OTC   zolpidem 10 MG tablet Commonly known as:  AMBIEN Take 1 tablet (10 mg total) by mouth at bedtime as needed for sleep.          Objective:   Physical Exam BP 124/68 (BP Location: Left Arm, Patient Position: Sitting, Cuff Size: Normal)   Pulse 87   Temp 98.2 F (36.8 C) (Oral)   Resp 14   Ht 5\' 11"  (1.803 m)   Wt 206 lb 6 oz (93.6 kg)   SpO2 98%   BMI 28.78 kg/m  General:   Well developed, well nourished . NAD.  Neck:  palpable thyroid, nontender, nonnodular.  Smooth, slightly larger on the right?Marland Kitchen HEENT:  Normocephalic . Face symmetric, atraumatic Lungs:  CTA B Normal respiratory effort, no intercostal retractions, no accessory muscle use. Heart: RRR,  no murmur.  No pretibial edema bilaterally  Abdomen:  Not distended, soft, non-tender. No rebound or rigidity.   Skin: Exposed areas without rash. Not pale. Not jaundice Rectal:  External abnormalities: none. Normal sphincter tone. No rectal masses or tenderness.    Stool brown  Prostate: Prostate gland firm and smooth, no enlargement, nodularity, tenderness, mass, asymmetry or induration.  Neurologic:  alert & oriented X3.  Speech normal, gait appropriate for age and unassisted Strength symmetric and appropriate for age.  Psych: Cognition and judgment appear intact.  Cooperative with normal attention span and concentration.  Behavior appropriate. No anxious or depressed appearing.     Assessment & Plan:    Assessment HTN Anxiety  Insomnia: Ambien as needed  GERD Heart murmur, echo normal 2005 Erectile dysfunction Urolithiasis Mildly elevated LFTs no w/u as off   PLAN: HTN: Well-controlled, continue amlodipine.  Anxiety, Insomnia: On Ambien as needed, doing ok Thyromegaly?:  Check [place] Fungal infection, feet: Try ketoconazole.  See instructions RTC 1 year

## 2017-12-11 NOTE — Assessment & Plan Note (Addendum)
-  Td 1-18;  Flu shot declined  - CCS: never had a cscope, no FH, no sx  -prostate cancer screening --> DRE negative today, PSA was normal but in the high side before.  Recheck today.  No sx  -Labs: CMP, FLP, TSH, PSA, thyroid ultrasound - diet and exercise discussed, doing well, he remains active -Tobacco abuse: has decreased  amount of cigarettes further down.  Praised

## 2017-12-11 NOTE — Assessment & Plan Note (Signed)
HTN: Well-controlled, continue amlodipine.  Anxiety, Insomnia: On Ambien as needed, doing ok Thyromegaly?:  Check US Fungal infection, feet: Try ketoconazole.  See instructions RTC 1 year

## 2017-12-11 NOTE — Progress Notes (Signed)
Pre visit review using our clinic review tool, if applicable. No additional management support is needed unless otherwise documented below in the visit note. 

## 2017-12-11 NOTE — Addendum Note (Signed)
Addended byConrad Earlston: Darrelyn Morro D on: 12/11/2017 02:03 PM   Modules accepted: Orders

## 2017-12-11 NOTE — Patient Instructions (Signed)
GO TO THE LAB : Get the blood work     GO TO THE FRONT DESK Schedule your next appointment for a physical exam in 1 year   Use the cream twice a day for 10 days Keep your feet dry with powder Call if not improving

## 2018-02-09 DIAGNOSIS — M62830 Muscle spasm of back: Secondary | ICD-10-CM | POA: Diagnosis not present

## 2018-02-09 DIAGNOSIS — M1288 Other specific arthropathies, not elsewhere classified, other specified site: Secondary | ICD-10-CM | POA: Diagnosis not present

## 2018-02-09 DIAGNOSIS — M48061 Spinal stenosis, lumbar region without neurogenic claudication: Secondary | ICD-10-CM | POA: Diagnosis not present

## 2018-02-09 DIAGNOSIS — M2578 Osteophyte, vertebrae: Secondary | ICD-10-CM | POA: Diagnosis not present

## 2018-02-09 DIAGNOSIS — M5416 Radiculopathy, lumbar region: Secondary | ICD-10-CM | POA: Diagnosis not present

## 2018-02-09 DIAGNOSIS — M8938 Hypertrophy of bone, other site: Secondary | ICD-10-CM | POA: Diagnosis not present

## 2018-02-09 DIAGNOSIS — M47896 Other spondylosis, lumbar region: Secondary | ICD-10-CM | POA: Diagnosis not present

## 2018-02-09 DIAGNOSIS — M4807 Spinal stenosis, lumbosacral region: Secondary | ICD-10-CM | POA: Diagnosis not present

## 2018-02-09 DIAGNOSIS — M545 Low back pain: Secondary | ICD-10-CM | POA: Diagnosis not present

## 2018-04-02 ENCOUNTER — Other Ambulatory Visit: Payer: Self-pay | Admitting: Internal Medicine

## 2018-04-03 NOTE — Telephone Encounter (Signed)
RF sent.

## 2018-04-03 NOTE — Telephone Encounter (Addendum)
Pt is requesting refill on Ambien .   Last OV: 12/11/2017 Last Fill: 08/17/2017 #30 and 5RF UDS: Not needed for Ambien per PCP  NCCR printed- no discrepancies noted- sent for scanning  Please advise.

## 2018-12-12 ENCOUNTER — Ambulatory Visit (INDEPENDENT_AMBULATORY_CARE_PROVIDER_SITE_OTHER): Payer: BLUE CROSS/BLUE SHIELD | Admitting: Internal Medicine

## 2018-12-12 ENCOUNTER — Encounter: Payer: Self-pay | Admitting: Gastroenterology

## 2018-12-12 ENCOUNTER — Encounter: Payer: Self-pay | Admitting: Internal Medicine

## 2018-12-12 VITALS — BP 126/72 | HR 61 | Temp 98.0°F | Resp 16 | Ht 71.0 in | Wt 203.4 lb

## 2018-12-12 DIAGNOSIS — F17200 Nicotine dependence, unspecified, uncomplicated: Secondary | ICD-10-CM | POA: Diagnosis not present

## 2018-12-12 DIAGNOSIS — E010 Iodine-deficiency related diffuse (endemic) goiter: Secondary | ICD-10-CM | POA: Diagnosis not present

## 2018-12-12 DIAGNOSIS — Z1211 Encounter for screening for malignant neoplasm of colon: Secondary | ICD-10-CM

## 2018-12-12 DIAGNOSIS — Z0000 Encounter for general adult medical examination without abnormal findings: Secondary | ICD-10-CM

## 2018-12-12 DIAGNOSIS — G4700 Insomnia, unspecified: Secondary | ICD-10-CM

## 2018-12-12 LAB — LIPID PANEL
CHOL/HDL RATIO: 3
Cholesterol: 180 mg/dL (ref 0–200)
HDL: 53.9 mg/dL (ref >39.00)
LDL Cholesterol: 107 mg/dL — ABNORMAL HIGH (ref 0–99)
NONHDL: 126.47
TRIGLYCERIDES: 98 mg/dL (ref 0.0–149.0)
VLDL: 19.6 mg/dL (ref 0.0–40.0)

## 2018-12-12 LAB — PSA: PSA: 2.94 ng/mL (ref 0.10–4.00)

## 2018-12-12 LAB — BASIC METABOLIC PANEL
BUN: 12 mg/dL (ref 6–23)
CALCIUM: 9.2 mg/dL (ref 8.4–10.5)
CO2: 24 meq/L (ref 19–32)
CREATININE: 0.94 mg/dL (ref 0.40–1.50)
Chloride: 106 mEq/L (ref 96–112)
GFR: 102.97 mL/min (ref >60.00)
GLUCOSE: 90 mg/dL (ref 70–99)
Potassium: 4.3 mEq/L (ref 3.5–5.1)
Sodium: 139 mEq/L (ref 135–145)

## 2018-12-12 LAB — CBC WITH DIFFERENTIAL/PLATELET
BASOS ABS: 0.1 10*3/uL (ref 0.0–0.1)
Basophils Relative: 1.2 % (ref 0.0–3.0)
EOS ABS: 0.2 10*3/uL (ref 0.0–0.7)
Eosinophils Relative: 2.9 % (ref 0.0–5.0)
HCT: 46.9 % (ref 39.0–52.0)
Hemoglobin: 16.3 g/dL (ref 13.0–17.0)
LYMPHS ABS: 1.8 10*3/uL (ref 0.7–4.0)
LYMPHS PCT: 31.3 % (ref 12.0–46.0)
MCHC: 34.8 g/dL (ref 30.0–36.0)
MCV: 94 fl (ref 78.0–100.0)
MONOS PCT: 7.2 % (ref 3.0–12.0)
Monocytes Absolute: 0.4 10*3/uL (ref 0.1–1.0)
NEUTROS ABS: 3.2 10*3/uL (ref 1.4–7.7)
NEUTROS PCT: 57.4 % (ref 43.0–77.0)
PLATELETS: 355 10*3/uL (ref 150.0–400.0)
RBC: 4.98 Mil/uL (ref 4.22–5.81)
RDW: 13.2 % (ref 11.5–15.5)
WBC: 5.6 10*3/uL (ref 4.0–10.5)

## 2018-12-12 MED ORDER — FAMOTIDINE 20 MG PO TABS: 20.0000 mg | ORAL_TABLET | Freq: Two times a day (BID) | ORAL | 3 refills | Status: DC

## 2018-12-12 MED ORDER — VARENICLINE TARTRATE 1 MG PO TABS: 1.0000 mg | ORAL_TABLET | Freq: Two times a day (BID) | ORAL | 2 refills | Status: DC

## 2018-12-12 MED ORDER — VARENICLINE TARTRATE 0.5 MG X 11 & 1 MG X 42 PO MISC: ORAL | 0 refills | Status: DC

## 2018-12-12 MED ORDER — AMLODIPINE BESYLATE 10 MG PO TABS: 10.0000 mg | ORAL_TABLET | Freq: Every day | ORAL | 3 refills | Status: DC

## 2018-12-12 MED ORDER — ZOLPIDEM TARTRATE 10 MG PO TABS: 10.0000 mg | ORAL_TABLET | Freq: Every evening | ORAL | 5 refills | Status: DC | PRN

## 2018-12-12 NOTE — Assessment & Plan Note (Signed)
HTN: Continue amlodipine Anxiety, insomnia: Refill Ambien, contract today. Thyromegaly: Ultrasound referral failed last year, will try again. GERD: Zantac helped but it has been recalled, switch to famotidine.   RTC 1 year

## 2018-12-12 NOTE — Progress Notes (Signed)
Pre visit review using our clinic review tool, if applicable. No additional management support is needed unless otherwise documented below in the visit note. 

## 2018-12-12 NOTE — Assessment & Plan Note (Addendum)
-  Td 1-18;  Flu shot declined again  - CCS: never had a cscope, no FH, pt is  AA, 3 modalities discussed, elected Cscope, referral sent  -prostate cancer screening --> DRE negative 2019, recheck a PSA   -Labs: bmp, flp, cbc,psa - diet and exercise discussed  -Tobacco abuse: Has cut down significantly, wife is taking Chantix successfully, he is counseled and we agreed to prescribe Chantix.

## 2018-12-12 NOTE — Patient Instructions (Signed)
GO TO THE LAB : Get the blood work     GO TO THE FRONT DESK Schedule your next appointment   For a physical in 1 year     Check the  blood pressure  Monthly  Be sure your blood pressure is between 110/65 and  135/85. If it is consistently higher or lower, let me know   Start Chantix when ready   Steps to Quit Smoking  Smoking tobacco can be harmful to your health and can affect almost every organ in your body. Smoking puts you, and those around you, at risk for developing many serious chronic diseases. Quitting smoking is difficult, but it is one of the best things that you can do for your health. It is never too late to quit. What are the benefits of quitting smoking? When you quit smoking, you lower your risk of developing serious diseases and conditions, such as:  Lung cancer or lung disease, such as COPD.  Heart disease.  Stroke.  Heart attack.  Infertility.  Osteoporosis and bone fractures. Additionally, symptoms such as coughing, wheezing, and shortness of breath may get better when you quit. You may also find that you get sick less often because your body is stronger at fighting off colds and infections. If you are pregnant, quitting smoking can help to reduce your chances of having a baby of low birth weight. How do I get ready to quit? When you decide to quit smoking, create a plan to make sure that you are successful. Before you quit:  Pick a date to quit. Set a date within the next two weeks to give you time to prepare.  Write down the reasons why you are quitting. Keep this list in places where you will see it often, such as on your bathroom mirror or in your car or wallet.  Identify the people, places, things, and activities that make you want to smoke (triggers) and avoid them. Make sure to take these actions: ? Throw away all cigarettes at home, at work, and in your car. ? Throw away smoking accessories, such as Set designerashtrays and lighters. ? Clean your car and  make sure to empty the ashtray. ? Clean your home, including curtains and carpets.  Tell your family, friends, and coworkers that you are quitting. Support from your loved ones can make quitting easier.  Talk with your health care provider about your options for quitting smoking.  Find out what treatment options are covered by your health insurance. What strategies can I use to quit smoking? Talk with your healthcare provider about different strategies to quit smoking. Some strategies include:  Quitting smoking altogether instead of gradually lessening how much you smoke over a period of time. Research shows that quitting "cold [place]key" is more successful than gradually quitting.  Attending in-person counseling to help you build problem-solving skills. You are more likely to have success in quitting if you attend several counseling sessions. Even short sessions of 10 minutes can be effective.  Finding resources and support systems that can help you to quit smoking and remain smoke-free after you quit. These resources are most helpful when you use them often. They can include: ? Online chats with a Veterinary surgeoncounselor. ? Telephone quitlines. ? Automotive engineerrinted self-help materials. ? Support groups or group counseling. ? Text messaging programs. ? Mobile phone applications.  Taking medicines to help you quit smoking. (If you are pregnant or breastfeeding, talk with your health care provider first.) Some medicines contain nicotine and some do  not. Both types of medicines help with cravings, but the medicines that include nicotine help to relieve withdrawal symptoms. Your health care provider may recommend: ? Nicotine patches, gum, or lozenges. ? Nicotine inhalers or sprays. ? Non-nicotine medicine that is taken by mouth. Talk with your health care provider about combining strategies, such as taking medicines while you are also receiving in-person counseling. Using these two strategies together makes you more  likely to succeed in quitting than if you used either strategy on its own. If you are pregnant or breastfeeding, talk with your health care provider about finding counseling or other support strategies to quit smoking. Do not take medicine to help you quit smoking unless told to do so by your health care provider. What things can I do to make it easier to quit? Quitting smoking might feel overwhelming at first, but there is a lot that you can do to make it easier. Take these important actions:  Reach out to your family and friends and ask that they support and encourage you during this time. Call telephone quitlines, reach out to support groups, or work with a counselor for support.  Ask people who smoke to avoid smoking around you.  Avoid places that trigger you to smoke, such as bars, parties, or smoke-break areas at work.  Spend time around people who do not smoke.  Lessen stress in your life, because stress can be a smoking trigger for some people. To lessen stress, try: ? Exercising regularly. ? Deep-breathing exercises. ? Yoga. ? Meditating. ? Performing a body scan. This involves closing your eyes, scanning your body from head to toe, and noticing which parts of your body are particularly tense. Purposefully relax the muscles in those areas.  Download or purchase mobile phone or tablet apps (applications) that can help you stick to your quit plan by providing reminders, tips, and encouragement. There are many free apps, such as QuitGuide from the [employer] developer for Disease Control and Prevention). You can find other support for quitting smoking (smoking cessation) through smokefree.gov and other websites. How will I feel when I quit smoking? Within the first 24 hours of quitting smoking, you may start to feel some withdrawal symptoms. These symptoms are usually most noticeable 2-3 days after quitting, but they usually do not last beyond 2-3 weeks. Changes or symptoms that you might  experience include:  Mood swings.  Restlessness, anxiety, or irritation.  Difficulty concentrating.  Dizziness.  Strong cravings for sugary foods in addition to nicotine.  Mild weight gain.  Constipation.  Nausea.  Coughing or a sore throat.  Changes in how your medicines work in your body.  A depressed mood.  Difficulty sleeping (insomnia). After the first 2-3 weeks of quitting, you may start to notice more positive results, such as:  Improved sense of smell and taste.  Decreased coughing and sore throat.  Slower heart rate.  Lower blood pressure.  Clearer skin.  The ability to breathe more easily.  Fewer sick days. Quitting smoking is very challenging for most people. Do not get discouraged if you are not successful the first time. Some people need to make many attempts to quit before they achieve long-term success. Do your best to stick to your quit plan, and talk with your health care provider if you have any questions or concerns. This information is not intended to replace advice given to you by your health care provider. Make sure you discuss any questions you have with your health care provider. Document  Released: 10/18/2001 Document Revised: 05/30/2017 Document Reviewed: 03/10/2015 Elsevier Interactive Patient Education  2019 [employer].

## 2018-12-12 NOTE — Progress Notes (Signed)
Subjective:    Patient ID: Keith West, male    DOB: Aug 08, 1971, 48 y.o.   MRN: [id number]  DOS:  12/12/2018 Type of visit - description: cpx In general feeling well, no major concerns.   Review of Systems The last 2 weeks he had some sinus congestion, mild frontal headache.  Other than above, a 14 point review of systems is negative    Past Medical History:  Diagnosis Date  . Anxiety 02/13/2014  . ED (erectile dysfunction)   . GERD (gastroesophageal reflux disease)   . Heart murmur 1999   ECHO NI 2005-dr Kinnie Kaupp had done  . Kidney stones 2000    Past Surgical History:  Procedure Laterality Date  . BUNIONECTOMY  1991   right  . DENTAL SURGERY    . SHOULDER ARTHROSCOPY WITH SUBACROMIAL DECOMPRESSION AND BICEP TENDON REPAIR Right 03/25/2013   Procedure: RIGHT SHOULDER ARTHROSCOPY WITH SUBACROMIAL DECOMPRESSION DISTAL CLAVICLE RESECTON LONG HEAD BICEP  TENDODESIS ;  Surgeon: Mable ParisJustin William Chandler, MD;  Location: [facility];  Service: Orthopedics;  Laterality: Right;    Social History   Socioeconomic History  . Marital status: Married    Spouse name: Not on file  . Number of children: 3  . Years of education: Not on file  . Highest education level: Not on file  Occupational History  . Occupation: Personnel officerelectrician  Social Needs  . Financial resource strain: Not on file  . Food insecurity:    Worry: Not on file    Inability: Not on file  . Transportation needs:    Medical: Not on file    Non-medical: Not on file  Tobacco Use  . Smoking status: Current Some Day Smoker  . Smokeless tobacco: Never Used  . Tobacco comment: 3-4 cigarrets a day  Substance and Sexual Activity  . Alcohol use: Yes    Comment: socially  . Drug use: No  . Sexual activity: Not on file  Lifestyle  . Physical activity:    Days per week: Not on file    Minutes per session: Not on file  . Stress: Not on file  Relationships  . Social connections:    Talks on phone: Not on file    Gets together: Not on file    Attends religious service: Not on file    Active member of club or organization: Not on file    Attends meetings of clubs or organizations: Not on file    Relationship status: Not on file  . Intimate partner violence:    Fear of current or ex partner: Not on file    Emotionally abused: Not on file    Physically abused: Not on file    Forced sexual activity: Not on file  Other Topics Concern  . Not on file  Social History Narrative   Household-- pt, wife, 1 daughter     3 children, 5 Gkids   Lost 50 y/o child (cerebral palsy) ~ 11-2016     Family History  Problem Relation Age of Onset  . Hypertension Mother        F and M  . Diabetes Other        M&F family  . Stroke Paternal Aunt   . Dementia Maternal Grandmother   . Alcohol abuse Father   . Lung cancer Father   . Breast cancer Sister   . Coronary artery disease Neg Hx   . Colon cancer Neg Hx   . Prostate cancer Neg Hx   .  CAD Neg Hx      Allergies as of 12/12/2018      Reactions   Hydrocodone-acetaminophen Nausea And Vomiting      Medication List       Accurate as of December 12, 2018  6:19 PM. Always use your most recent med list.        amLODipine 10 MG tablet Commonly known as:  NORVASC Take 1 tablet (10 mg total) by mouth daily.   famotidine 20 MG tablet Commonly known as:  PEPCID Take 1 tablet (20 mg total) by mouth 2 (two) times daily.   varenicline 0.5 MG X 11 & 1 MG X 42 tablet Commonly known as:  CHANTIX STARTING MONTH PAK Take one 0.5 mg tablet by mouth once daily for 3 days, then increase to one 0.5 mg tablet twice daily for 4 days, then increase to one 1 mg tablet twice daily.   varenicline 1 MG tablet Commonly known as:  CHANTIX CONTINUING MONTH PAK Take 1 tablet (1 mg total) by mouth 2 (two) times daily.   zolpidem 10 MG tablet Commonly known as:  AMBIEN Take 1 tablet (10 mg total) by mouth at bedtime as needed. for sleep           Objective:    Physical Exam BP 126/72 (BP Location: Left Arm, Patient Position: Sitting, Cuff Size: Normal)   Pulse 61   Temp 98 F (36.7 C) (Oral)   Resp 16   Ht 5\' 11"  (1.803 m)   Wt 203 lb 6 oz (92.3 kg)   SpO2 96%   BMI 28.37 kg/m  General: Well developed, NAD, BMI noted Neck: + Thyromegaly, gland is slightly enlarged on the right side, not nodular or tender. HEENT:  Normocephalic . Face symmetric, atraumatic Lungs:  CTA B Normal respiratory effort, no intercostal retractions, no accessory muscle use. Heart: RRR,  no murmur.  No pretibial edema bilaterally  Abdomen:  Not distended, soft, non-tender. No rebound or rigidity.   Skin: Exposed areas without rash. Not pale. Not jaundice Neurologic:  alert & oriented X3.  Speech normal, gait appropriate for age and unassisted Strength symmetric and appropriate for age.  Psych: Cognition and judgment appear intact.  Cooperative with normal attention span and concentration.  Behavior appropriate. No anxious or depressed appearing.     Assessment      Assessment HTN Anxiety  Insomnia: Ambien as needed  GERD Heart murmur, echo normal 2005 Erectile dysfunction Urolithiasis Mildly elevated LFTs : Hep B-C (-) 2018   PLAN: HTN: Continue amlodipine Anxiety, insomnia: Refill Ambien, contract today. Thyromegaly: Ultrasound referral failed last year, will try again. GERD: Zantac helped but it has been recalled, switch to famotidine.   RTC 1 year

## 2018-12-20 ENCOUNTER — Ambulatory Visit (HOSPITAL_BASED_OUTPATIENT_CLINIC_OR_DEPARTMENT_OTHER)
Admission: RE | Admit: 2018-12-20 | Discharge: 2018-12-20 | Disposition: A | Discharge status: Home / Self Care | Payer: BLUE CROSS/BLUE SHIELD | Source: Ambulatory Visit | Attending: Internal Medicine | Admitting: Internal Medicine

## 2018-12-20 DIAGNOSIS — E010 Iodine-deficiency related diffuse (endemic) goiter: Secondary | ICD-10-CM | POA: Diagnosis not present

## 2018-12-31 ENCOUNTER — Ambulatory Visit: Payer: BLUE CROSS/BLUE SHIELD | Admitting: Gastroenterology

## 2019-02-11 ENCOUNTER — Other Ambulatory Visit: Payer: Self-pay | Admitting: Internal Medicine

## 2019-02-11 MED ORDER — VARENICLINE TARTRATE 1 MG PO TABS: 1.0000 mg | ORAL_TABLET | Freq: Two times a day (BID) | ORAL | 2 refills | Status: DC

## 2019-03-12 ENCOUNTER — Other Ambulatory Visit: Payer: Self-pay | Admitting: Internal Medicine

## 2019-04-15 ENCOUNTER — Encounter: Payer: Self-pay | Admitting: Gastroenterology

## 2019-05-14 ENCOUNTER — Other Ambulatory Visit: Payer: Self-pay

## 2019-05-14 ENCOUNTER — Ambulatory Visit (AMBULATORY_SURGERY_CENTER): Payer: BC Managed Care – PPO | Admitting: *Deleted

## 2019-05-14 VITALS — Ht 71.0 in | Wt 206.0 lb

## 2019-05-14 DIAGNOSIS — Z1211 Encounter for screening for malignant neoplasm of colon: Secondary | ICD-10-CM

## 2019-05-14 MED ORDER — SUPREP BOWEL PREP KIT 17.5-3.13-1.6 GM/177ML PO SOLN: 1.0000 | Freq: Once | ORAL | 0 refills | Status: AC

## 2019-05-14 NOTE — Progress Notes (Signed)
No egg or soy allergy known to patient  No issues with past sedation with any surgeries  or procedures, no intubation problems  No diet pills per patient No home 02 use per patient  No blood thinners per patient  Pt denies issues with constipation  No A fib or A flutter  EMMI video sent to pt's e mail   Pt verified name, DOB, address and insurance during PV today. Pt mailed instruction packet to included paper to complete and mail back to [facility] with addressed and stamped envelope, Emmi video, copy of consent form to read and not return, and instructions. Suprep $15  coupon mailed in packet. PV completed over the phone. Pt encouraged to call with questions or issues   Pt is aware that care partner will wait in the car during proceudre; if they feel like they will be too hot to wait in the car; they may wait in the lobby.  We want them to wear a mask (we do not have any that we can provide them), practice social distancing, and we will check their temperatures when they get here.  I did remind patient that their care partner needs to stay in the parking lot the entire time. Pt will wear mask into building.   

## 2019-05-27 ENCOUNTER — Telehealth: Payer: Self-pay | Admitting: Gastroenterology

## 2019-05-27 NOTE — Telephone Encounter (Signed)

## 2019-05-28 ENCOUNTER — Ambulatory Visit (AMBULATORY_SURGERY_CENTER): Payer: BC Managed Care – PPO | Admitting: Gastroenterology

## 2019-05-28 ENCOUNTER — Other Ambulatory Visit: Payer: Self-pay

## 2019-05-28 ENCOUNTER — Encounter: Payer: Self-pay | Admitting: Gastroenterology

## 2019-05-28 VITALS — BP 117/76 | HR 63 | Temp 98.3°F | Resp 11

## 2019-05-28 DIAGNOSIS — Z1211 Encounter for screening for malignant neoplasm of colon: Secondary | ICD-10-CM

## 2019-05-28 MED ORDER — SODIUM CHLORIDE 0.9 % IV SOLN: 500.0000 mL | Freq: Once | INTRAVENOUS | Status: DC

## 2019-05-28 NOTE — Op Note (Signed)
[place] Patient Name: Keith West Procedure Date: 05/28/2019 11:05 AM MRN: [id number] Endoscopist: [place]. Loletha Carrow , MD Age: 48 Referring MD:  Date of Birth: [birth year] Gender: Male Account #: [id number] Procedure:                Colonoscopy Indications:              Screening for colorectal malignant neoplasm, This                            is the patient's first colonoscopy Medicines:                Monitored Anesthesia Care Procedure:                Pre-Anesthesia Assessment:                           - Prior to the procedure, a History and Physical                            was performed, and patient medications and                            allergies were reviewed. The patient's tolerance of                            previous anesthesia was also reviewed. The risks                            and benefits of the procedure and the sedation                            options and risks were discussed with the patient.                            All questions were answered, and informed consent                            was obtained. Prior Anticoagulants: The patient has                            taken no previous anticoagulant or antiplatelet                            agents. ASA Grade Assessment: II - A patient with                            mild systemic disease. After reviewing the risks                            and benefits, the patient was deemed in                            satisfactory condition to undergo the procedure.  After obtaining informed consent, the colonoscope                            was passed under direct vision. Throughout the                            procedure, the patient's blood pressure, pulse, and                            oxygen saturations were monitored continuously. The                            Colonoscope was introduced through the anus and                            advanced to the the terminal  ileum, with                            identification of the appendiceal orifice and IC                            valve. The colonoscopy was performed without                            difficulty. The patient tolerated the procedure                            well. The quality of the bowel preparation was                            good. The terminal ileum, ileocecal valve,                            appendiceal orifice, and rectum were photographed. Scope In: 11:21:43 AM Scope Out: 11:34:58 AM Scope Withdrawal Time: 0 hours 10 minutes 0 seconds  Total Procedure Duration: 0 hours 13 minutes 15 seconds  Findings:                 The perianal and digital rectal examinations were                            normal.                           The terminal ileum appeared normal.                           A few small-mouthed diverticula were found in the                            left colon.                           The exam was otherwise without abnormality on  direct and retroflexion views. Complications:            No immediate complications. Estimated Blood Loss:     Estimated blood loss: none. Impression:               - The examined portion of the ileum was normal.                           - Diverticulosis in the left colon.                           - The examination was otherwise normal on direct                            and retroflexion views.                           - No specimens collected. Recommendation:           - Patient has a contact number available for                            emergencies. The signs and symptoms of potential                            delayed complications were discussed with the                            patient. Return to normal activities tomorrow.                            Written discharge instructions were provided to the                            patient.                           - Resume previous diet.                            - Continue present medications.                           - Repeat colonoscopy in 10 years for screening                            purposes. Lasasha Brophy L. Myrtie Neitheranis, MD 05/28/2019 11:40:52 AM This report has been signed electronically.

## 2019-05-28 NOTE — Progress Notes (Signed)
Pt's states no medical or surgical changes since previsit or office visit. 

## 2019-05-28 NOTE — Patient Instructions (Addendum)
YOU HAD AN ENDOSCOPIC PROCEDURE TODAY AT [facility]:   Refer to the procedure report that was given to you for any specific questions about what was found during the examination.  If the procedure report does not answer your questions, please call your gastroenterologist to clarify.  If you requested that your care partner not be given the details of your procedure findings, then the procedure report has been included in a sealed envelope for you to review at your convenience later.  **Handouts given on Diverticulosis**  YOU SHOULD EXPECT: Some feelings of bloating in the abdomen. Passage of more gas than usual.  Walking can help get rid of the air that was put into your GI tract during the procedure and reduce the bloating. If you had a lower endoscopy (such as a colonoscopy or flexible sigmoidoscopy) you may notice spotting of blood in your stool or on the toilet paper. If you underwent a bowel prep for your procedure, you may not have a normal bowel movement for a few days.  Please Note:  You might notice some irritation and congestion in your nose or some drainage.  This is from the oxygen used during your procedure.  There is no need for concern and it should clear up in a day or so.  SYMPTOMS TO REPORT IMMEDIATELY:   Following lower endoscopy (colonoscopy or flexible sigmoidoscopy):  Excessive amounts of blood in the stool  Significant tenderness or worsening of abdominal pains  Swelling of the abdomen that is new, acute  Fever of 100F or higher   For urgent or emergent issues, a gastroenterologist can be reached at any hour by calling [phone].   DIET:  We do recommend a small meal at first, but then you may proceed to your regular diet.  Drink plenty of fluids but you should avoid alcoholic beverages for 24 hours.  ACTIVITY:  You should plan to take it easy for the rest of today and you should NOT DRIVE or use heavy machinery until tomorrow (because of the  sedation medicines used during the test).    FOLLOW UP: Our staff will call the number listed on your records 48-72 hours following your procedure to check on you and address any questions or concerns that you may have regarding the information given to you following your procedure. If we do not reach you, we will leave a message.  We will attempt to reach you two times.  During this call, we will ask if you have developed any symptoms of COVID 19. If you develop any symptoms (ie: fever, flu-like symptoms, shortness of breath, cough etc.) before then, please call [phone].  If you test positive for Covid 19 in the 2 weeks post procedure, please call and report this information to [place].    If any biopsies were taken you will be contacted by phone or by letter within the next 1-3 weeks.  Please call us at [phone] if you have not heard about the biopsies in 3 weeks.    SIGNATURES/CONFIDENTIALITY: You and/or your care partner have signed paperwork which will be entered into your electronic medical record.  These signatures attest to the fact that that the information above on your After Visit Summary has been reviewed and is understood.  Full responsibility of the confidentiality of this discharge information lies with you and/or your care-partner.

## 2019-05-28 NOTE — Progress Notes (Signed)
Report given to PACU, vss 

## 2019-05-30 ENCOUNTER — Telehealth: Payer: Self-pay

## 2019-05-30 NOTE — Telephone Encounter (Signed)
  Follow up Call-  Call back number 05/28/2019  Post procedure Call Back phone  # [phone]  Permission to leave phone message Yes  Some recent data might be hidden     Patient questions:  Do you have a fever, pain , or abdominal swelling? No. Pain Score  0 *  Have you tolerated food without any problems? Yes.    Have you been able to return to your normal activities? Yes.    Do you have any questions about your discharge instructions: Diet   No. Medications  No. Follow up visit  No.  Do you have questions or concerns about your Care? No.  Actions: * If pain score is 4 or above: No action needed, pain <4.  1. Have you developed a fever since your procedure? no  2.   Have you had an respiratory symptoms (SOB or cough) since your procedure? no  3.   Have you tested positive for COVID 19 since your procedure no  4.   Have you had any family members/close contacts diagnosed with the COVID 19 since your procedure?  no   If yes to any of these questions please route to Joylene John, RN and Alphonsa Gin, Therapist, sports.

## 2019-06-05 ENCOUNTER — Other Ambulatory Visit: Payer: Self-pay | Admitting: Internal Medicine

## 2019-06-05 NOTE — Telephone Encounter (Signed)
Prescription sent, continue for 2 months, 1 tablet twice a day

## 2019-06-05 NOTE — Telephone Encounter (Signed)
Refill request for Chantix continuing pack. Okay to refill?

## 2019-06-30 ENCOUNTER — Other Ambulatory Visit: Payer: Self-pay | Admitting: Internal Medicine

## 2019-07-15 ENCOUNTER — Other Ambulatory Visit: Payer: Self-pay | Admitting: Internal Medicine

## 2019-11-27 IMAGING — US US THYROID
1 series · 14 of 25 positions shown · non-contrast
Comparison: None.

CLINICAL DATA: Palpable abnormality. Thyromegaly on physical
examination.

EXAM:
THYROID ULTRASOUND
TECHNIQUE: Ultrasound examination of the thyroid gland and adjacent soft
tissues was performed.

[Series 1: us thyroid · 0.07mm/px · 14 of 34 slices shown]
[im 1/34]
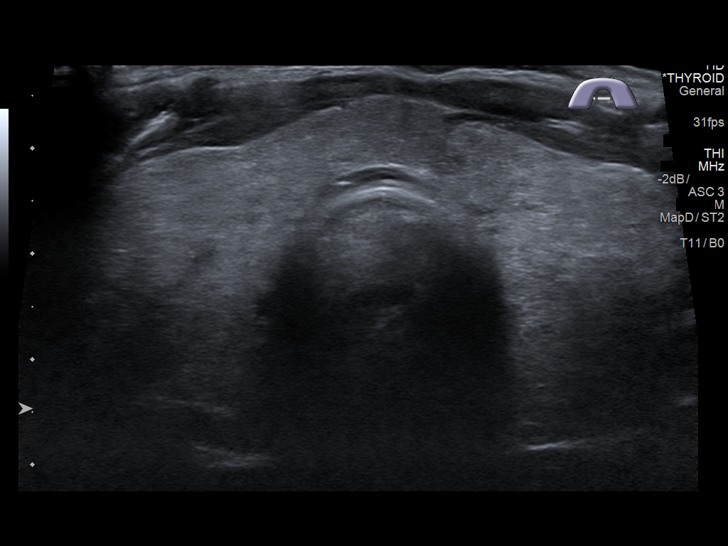
[im 3/34]
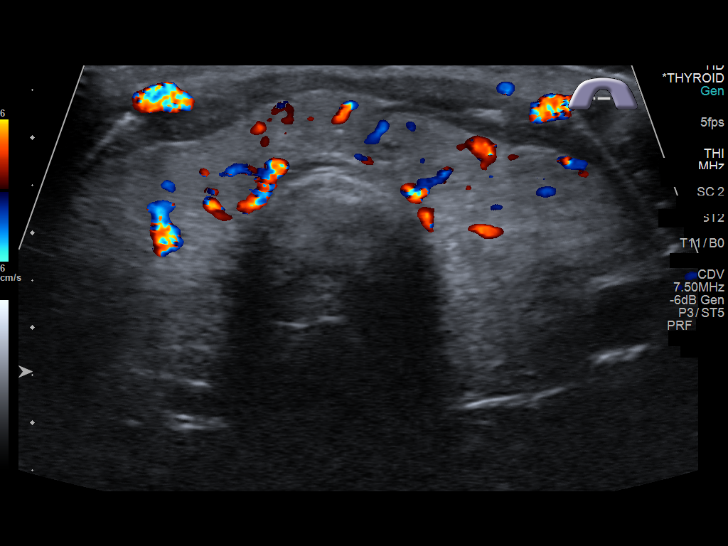
[im 6/34]
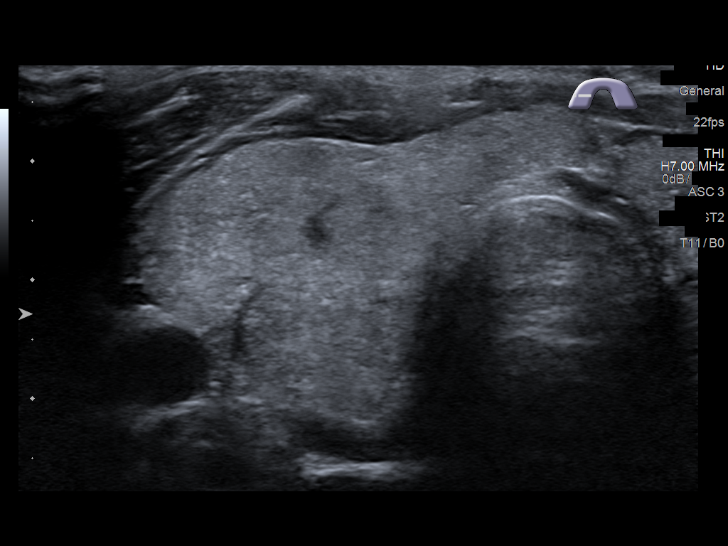
[im 9/34]
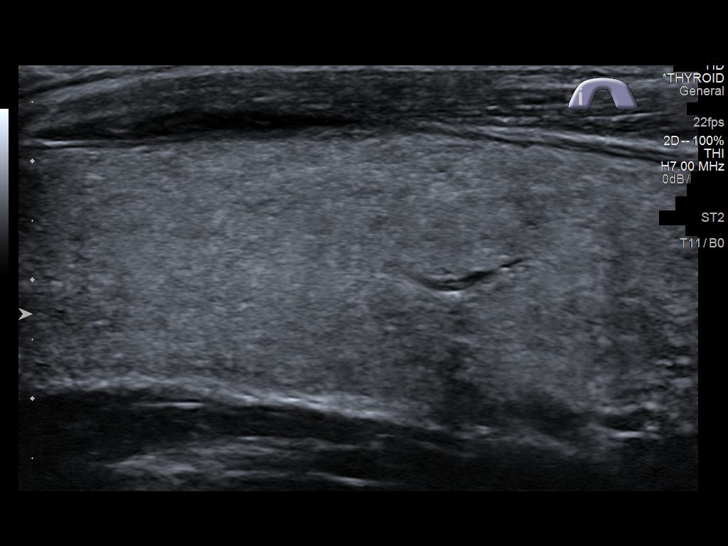
[im 12/34]
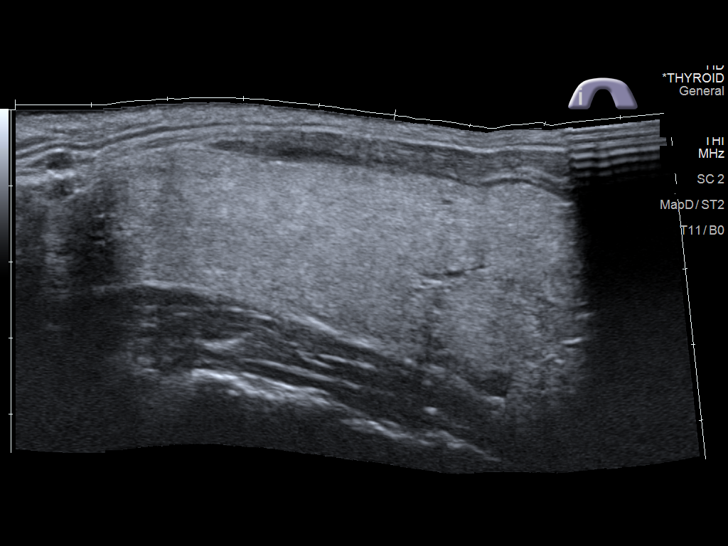
[im 13/34]
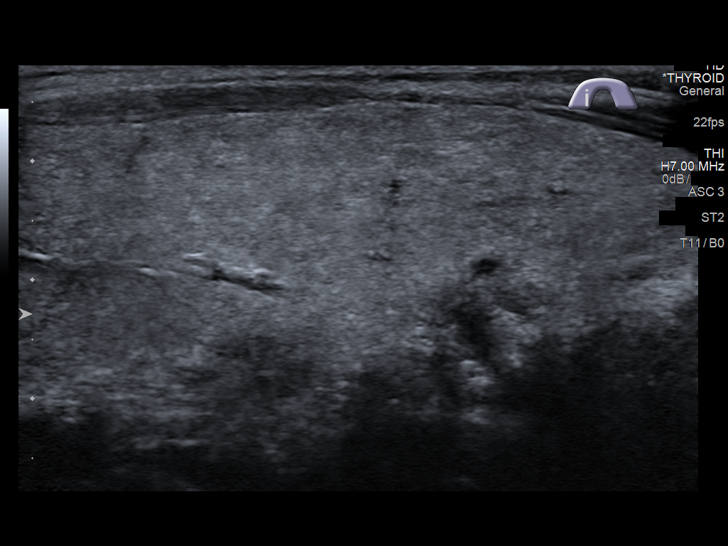
[im 16/34]
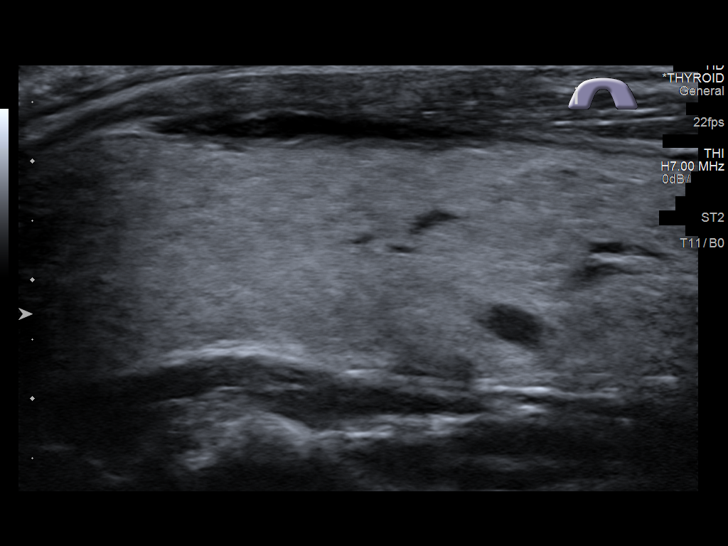
[im 18/34]
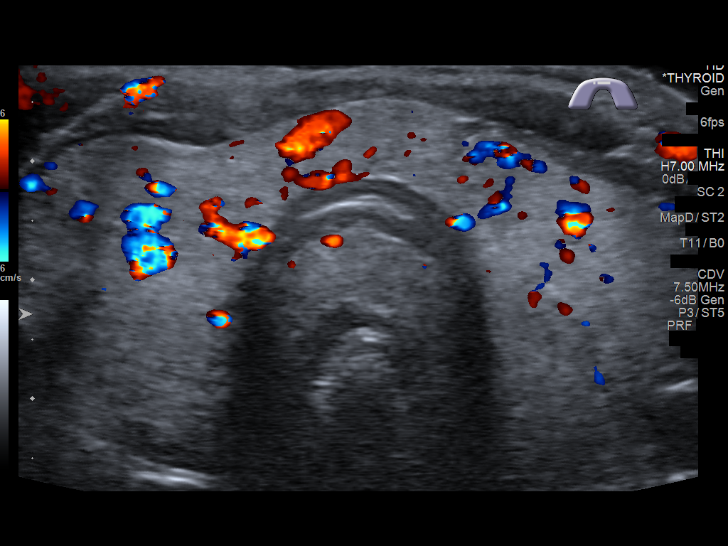
[im 21/34]
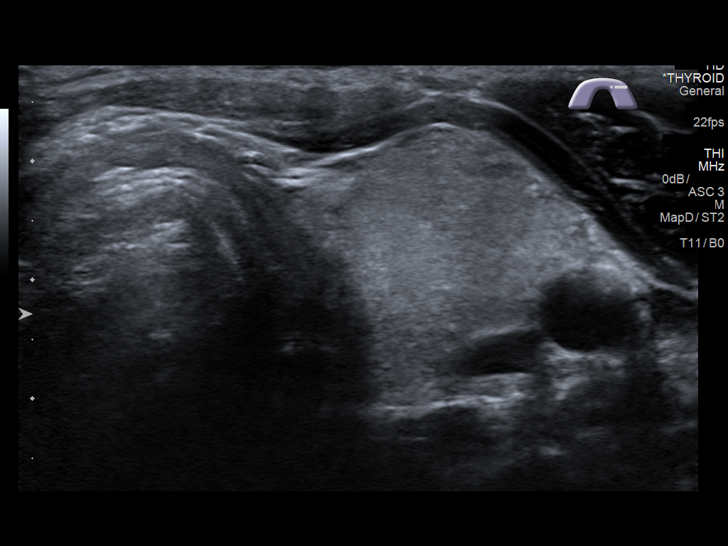
[im 23/34]
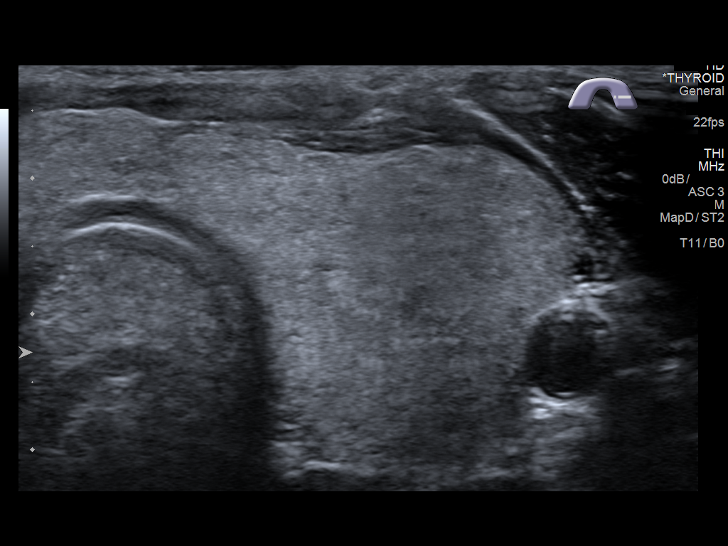
[im 25/34]
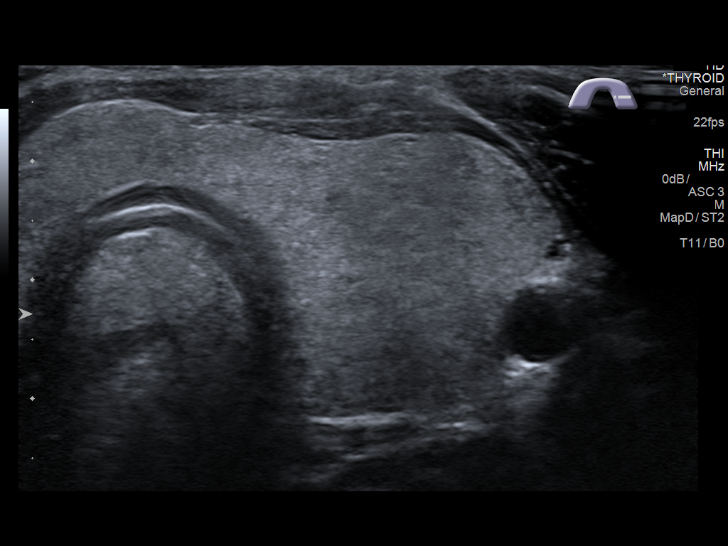
[im 28/34]
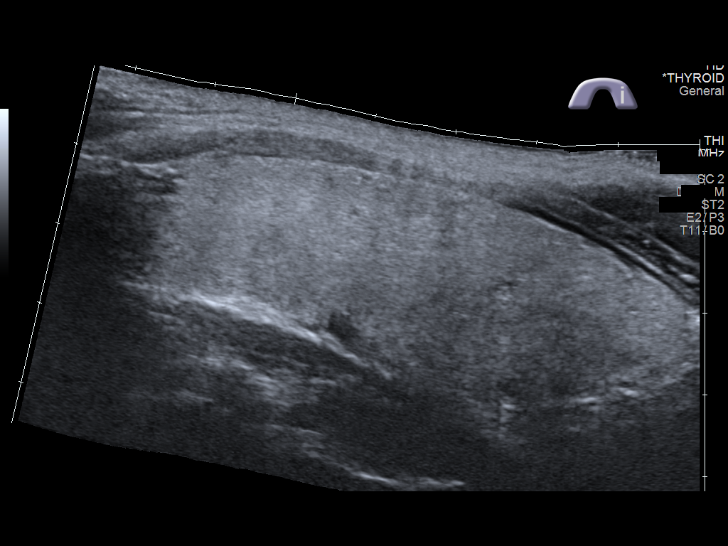
[im 31/34]
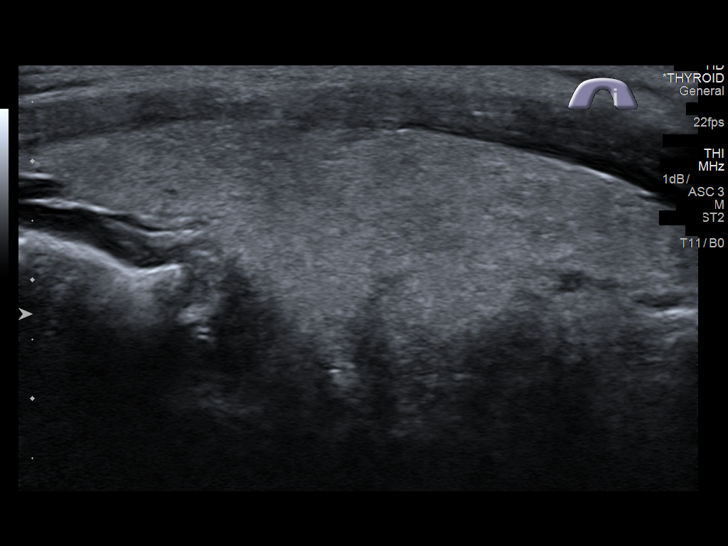
[im 34/34]
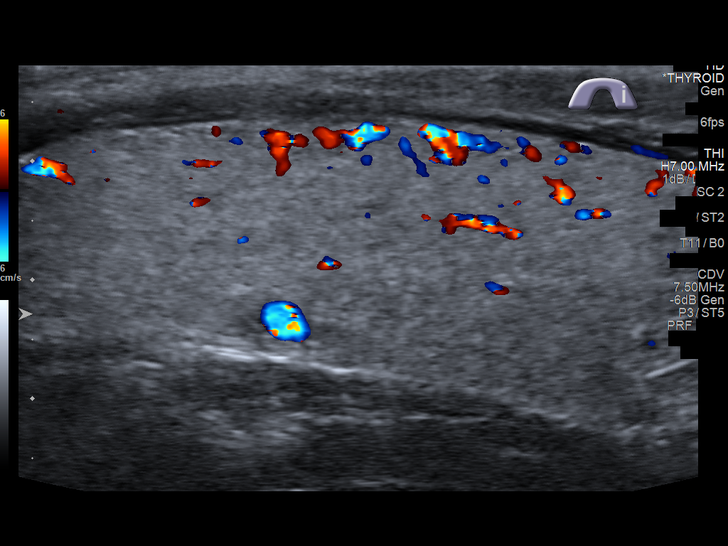

[14 of 25 positions shown; findings below may reference images not displayed]

FINDINGS: Parenchymal Echotexture: Mildly heterogenous - potential mild
glandular hyperemia (representative images 1111 and 34).

Isthmus: Normal in size measures 0.7 cm in diameter

Right lobe: Enlarged measuring 7.2 x 2.5 x 2.6 cm

Left lobe: Enlarged measuring 6.4 x 2.4 x 2.5 cm.

_________________________________________________________

Estimated total number of nodules >/= 1 cm: 0

Number of spongiform nodules >/=  2 cm not described below (TR1): 0

Number of mixed cystic and solid nodules >/= 1.5 cm not described
below (TR2): 0

_________________________________________________________

No discrete nodules are seen within the thyroid gland.
IMPRESSION: Mildly enlarged, heterogeneous and potentially mildly hyperemic
thyroid gland without discrete nodule or mass. Findings are
nonspecific though could be seen in the setting of a thyroiditis.
Clinical correlation is advised.

## 2019-12-05 ENCOUNTER — Other Ambulatory Visit: Payer: Self-pay | Admitting: Internal Medicine

## 2019-12-16 ENCOUNTER — Other Ambulatory Visit: Payer: Self-pay

## 2019-12-17 ENCOUNTER — Encounter: Payer: BLUE CROSS/BLUE SHIELD | Admitting: Internal Medicine

## 2020-01-24 ENCOUNTER — Other Ambulatory Visit: Payer: Self-pay

## 2020-01-27 ENCOUNTER — Other Ambulatory Visit: Payer: Self-pay

## 2020-01-27 ENCOUNTER — Encounter: Payer: Self-pay | Admitting: Internal Medicine

## 2020-01-27 ENCOUNTER — Ambulatory Visit (INDEPENDENT_AMBULATORY_CARE_PROVIDER_SITE_OTHER): Payer: BC Managed Care – PPO | Admitting: Internal Medicine

## 2020-01-27 VITALS — BP 133/81 | HR 88 | Temp 97.4°F | Resp 16 | Ht 71.0 in | Wt 206.4 lb

## 2020-01-27 DIAGNOSIS — F17200 Nicotine dependence, unspecified, uncomplicated: Secondary | ICD-10-CM | POA: Diagnosis not present

## 2020-01-27 DIAGNOSIS — Z0000 Encounter for general adult medical examination without abnormal findings: Secondary | ICD-10-CM

## 2020-01-27 DIAGNOSIS — E785 Hyperlipidemia, unspecified: Secondary | ICD-10-CM

## 2020-01-27 LAB — CBC WITH DIFFERENTIAL/PLATELET
Basophils Absolute: 0.1 10*3/uL (ref 0.0–0.1)
Basophils Relative: 1.2 % (ref 0.0–3.0)
Eosinophils Absolute: 0.1 10*3/uL (ref 0.0–0.7)
Eosinophils Relative: 0.9 % (ref 0.0–5.0)
HCT: 42.3 % (ref 39.0–52.0)
Hemoglobin: 15.1 g/dL (ref 13.0–17.0)
Lymphocytes Relative: 28.3 % (ref 12.0–46.0)
Lymphs Abs: 1.9 10*3/uL (ref 0.7–4.0)
MCHC: 35.6 g/dL (ref 30.0–36.0)
MCV: 92.1 fl (ref 78.0–100.0)
Monocytes Absolute: 0.5 10*3/uL (ref 0.1–1.0)
Monocytes Relative: 7.1 % (ref 3.0–12.0)
Neutro Abs: 4.3 10*3/uL (ref 1.4–7.7)
Neutrophils Relative %: 62.5 % (ref 43.0–77.0)
Platelets: 336 10*3/uL (ref 150.0–400.0)
RBC: 4.6 Mil/uL (ref 4.22–5.81)
RDW: 13.1 % (ref 11.5–15.5)
WBC: 6.9 10*3/uL (ref 4.0–10.5)

## 2020-01-27 LAB — COMPREHENSIVE METABOLIC PANEL
ALT: 32 U/L (ref 0–53)
AST: 31 U/L (ref 0–37)
Albumin: 4.4 g/dL (ref 3.5–5.2)
Alkaline Phosphatase: 99 U/L (ref 39–117)
BUN: 9 mg/dL (ref 6–23)
CO2: 26 mEq/L (ref 19–32)
Calcium: 9.2 mg/dL (ref 8.4–10.5)
Chloride: 104 mEq/L (ref 96–112)
Creatinine, Ser: 0.84 mg/dL (ref 0.40–1.50)
GFR: 116.71 mL/min (ref >60.00)
Glucose, Bld: 86 mg/dL (ref 70–99)
Potassium: 3.8 mEq/L (ref 3.5–5.1)
Sodium: 140 mEq/L (ref 135–145)
Total Bilirubin: 0.6 mg/dL (ref 0.2–1.2)
Total Protein: 7.6 g/dL (ref 6.0–8.3)

## 2020-01-27 LAB — LIPID PANEL
Cholesterol: 187 mg/dL (ref 0–200)
HDL: 60.4 mg/dL (ref >39.00)
LDL Cholesterol: 111 mg/dL — ABNORMAL HIGH (ref 0–99)
NonHDL: 126.15
Total CHOL/HDL Ratio: 3
Triglycerides: 78 mg/dL (ref 0.0–149.0)
VLDL: 15.6 mg/dL (ref 0.0–40.0)

## 2020-01-27 LAB — PSA: PSA: 3.02 ng/mL (ref 0.10–4.00)

## 2020-01-27 NOTE — Progress Notes (Signed)
Subjective:    Patient ID: Keith West, male    DOB: 09-Oct-1971, 49 y.o.   MRN: [id number]  DOS:  01/27/2020 Type of visit - description: CPX Has no major concerns, good compliance with medications.   Review of Systems  A 14 point review of systems is negative    Past Medical History:  Diagnosis Date  . Allergy   . Anxiety 02/13/2014  . ED (erectile dysfunction)   . GERD (gastroesophageal reflux disease)   . Heart murmur 1999   ECHO NI 2005-dr Jony Ladnier had done  . Hypertension    Past hx   . Kidney stones 2000    Past Surgical History:  Procedure Laterality Date  . [place]   right  . DENTAL SURGERY    . SHOULDER ARTHROSCOPY WITH SUBACROMIAL DECOMPRESSION AND BICEP TENDON REPAIR Right 03/25/2013   Procedure: RIGHT SHOULDER ARTHROSCOPY WITH SUBACROMIAL DECOMPRESSION DISTAL CLAVICLE RESECTON LONG HEAD BICEP  TENDODESIS ;  Surgeon: Nita Sells, MD;  Location: [facility];  Service: Orthopedics;  Laterality: Right;   Family History  Problem Relation Age of Onset  . Hypertension Mother        F and M  . Colon polyps Mother   . Diabetes Other        M&F family  . Stroke Paternal Aunt   . Dementia Maternal Grandmother   . Alcohol abuse Father   . Lung cancer Father   . Breast cancer Sister   . Coronary artery disease Neg Hx   . Colon cancer Neg Hx   . Prostate cancer Neg Hx   . CAD Neg Hx   . Esophageal cancer Neg Hx   . Rectal cancer Neg Hx   . Stomach cancer Neg Hx     Allergies as of 01/27/2020      Reactions   Hydrocodone-acetaminophen Nausea And Vomiting      Medication List       Accurate as of January 27, 2020  2:53 PM. If you have any questions, ask your nurse or doctor.        STOP taking these medications   famotidine 20 MG tablet Commonly known as: Pepcid Stopped by: Kathlene November, MD     TAKE these medications   amLODipine 10 MG tablet Commonly known as: NORVASC Take 1 tablet (10 mg total) by mouth daily.    multivitamin tablet Take 1 tablet by mouth daily. One  A Day Mens   omeprazole 20 MG tablet Commonly known as: PRILOSEC OTC Take 20 mg by mouth daily.   varenicline 1 MG tablet Commonly known as: Chantix Continuing Month Pak Take 1 tablet (1 mg total) by mouth 2 (two) times daily.   zolpidem 10 MG tablet Commonly known as: AMBIEN Take 1 tablet (10 mg total) by mouth at bedtime as needed. for sleep          Objective:   Physical Exam BP 133/81 (BP Location: Left Arm, Patient Position: Sitting, Cuff Size: Normal)   Pulse 88   Temp (!) 97.4 F (36.3 C) (Temporal)   Resp 16   Ht 5\' 11"  (1.803 m)   Wt 206 lb 6 oz (93.6 kg)   SpO2 99%   BMI 28.78 kg/m  General: Well developed, NAD, BMI noted Neck: No  thyromegaly  HEENT:  Normocephalic . Face symmetric, atraumatic Lungs:  CTA B Normal respiratory effort, no intercostal retractions, no accessory muscle use. Heart: RRR,  no murmur.  Abdomen:  Not distended, soft, non-tender. No rebound or rigidity.   Lower extremities: no pretibial edema bilaterally DRE: Normal sphincter tone, no stools, prostate slightly enlarged but not tender or nodular. Skin: Exposed areas without rash. Not pale. Not jaundice Neurologic:  alert & oriented X3.  Speech normal, gait appropriate for age and unassisted Strength symmetric and appropriate for age.  Psych: Cognition and judgment appear intact.  Cooperative with normal attention span and concentration.  Behavior appropriate. No anxious or depressed appearing.     Assessment    Assessment HTN Anxiety  Insomnia: Ambien as needed  GERD Heart murmur, echo normal 2005 Erectile dysfunction Urolithiasis Mildly elevated LFTs : Hep B-C (-) 2018   PLAN: Here for CPX HTN: On amlodipine, no apparent side effects, no recent ambulatory BPs, recommend to check blood pressures from time to time Insomnia: On Ambien, takes rarely.  RF is needed GERD: Asymptomatic on OTC Prilosec, he takes  every other day RTC 1 year.  This visit occurred during the SARS-CoV-2 public health emergency.  Safety protocols were in place, including screening questions prior to the visit, additional usage of staff PPE, and extensive cleaning of exam room while observing appropriate contact time as indicated for disinfecting solutions.

## 2020-01-27 NOTE — Assessment & Plan Note (Signed)
-  Td 1-18 -Covid vaccine recommended - CCS: Had a colonoscopy 05-2019, next per GI. -prostate cancer screening --> DRE negative today, recheck a PSA.  Asymptomatic.  -Labs:  CMP, FLP, CBC, PSA - diet and exercise discussed  -Tobacco abuse: Intolerant to Chantix due to nausea, he however is doing better, smokes 1 or 2 cigarettes a day.

## 2020-01-27 NOTE — Patient Instructions (Addendum)
Continue same medications, call for refill when you needed  Check the  blood pressure 2 times a month  BP GOAL is between 110/65 and  135/85. If it is consistently higher or lower, let me know   GO TO THE LAB : Get the blood work     GO TO THE FRONT DESK, please reschedule your appointments Come back for   physical exam in 1 year

## 2020-01-27 NOTE — Progress Notes (Signed)
Pre visit review using our clinic review tool, if applicable. No additional management support is needed unless otherwise documented below in the visit note. 

## 2020-01-27 NOTE — Assessment & Plan Note (Signed)
Here for CPX HTN: On amlodipine, no apparent side effects, no recent ambulatory BPs, recommend to check blood pressures from time to time Insomnia: On Ambien, takes rarely.  RF is needed GERD: Asymptomatic on OTC Prilosec, he takes every other day RTC 1 year.

## 2020-01-28 ENCOUNTER — Telehealth: Payer: Self-pay | Admitting: Internal Medicine

## 2020-01-28 NOTE — Telephone Encounter (Signed)
Ambien refill.   Last OV: 01/27/2020 Last Fill: 12/12/2018 #30 and 5RF UDS: Ambien only

## 2020-01-28 NOTE — Telephone Encounter (Signed)
PDMP okay, Rx sent 

## 2020-01-30 MED ORDER — ATORVASTATIN CALCIUM 10 MG PO TABS: 10.0000 mg | ORAL_TABLET | Freq: Every day | ORAL | 3 refills | Status: DC

## 2020-01-30 NOTE — Addendum Note (Signed)
Addended byConrad Simms D on: 01/30/2020 01:10 PM   Modules accepted: Orders

## 2020-02-26 ENCOUNTER — Other Ambulatory Visit: Payer: Self-pay | Admitting: Internal Medicine

## 2020-03-11 ENCOUNTER — Other Ambulatory Visit: Payer: Self-pay

## 2020-03-11 ENCOUNTER — Other Ambulatory Visit (INDEPENDENT_AMBULATORY_CARE_PROVIDER_SITE_OTHER): Payer: BC Managed Care – PPO

## 2020-03-11 DIAGNOSIS — E785 Hyperlipidemia, unspecified: Secondary | ICD-10-CM

## 2020-03-11 LAB — LIPID PANEL
Cholesterol: 167 mg/dL (ref 0–200)
HDL: 57.1 mg/dL (ref >39.00)
LDL Cholesterol: 83 mg/dL (ref 0–99)
NonHDL: 109.77
Total CHOL/HDL Ratio: 3
Triglycerides: 136 mg/dL (ref 0.0–149.0)
VLDL: 27.2 mg/dL (ref 0.0–40.0)

## 2020-03-11 LAB — ALT: ALT: 45 U/L (ref 0–53)

## 2020-03-11 LAB — AST: AST: 36 U/L (ref 0–37)

## 2020-03-11 MED ORDER — ATORVASTATIN CALCIUM 10 MG PO TABS: 10.0000 mg | ORAL_TABLET | Freq: Every day | ORAL | 3 refills | Status: DC

## 2020-03-11 NOTE — Addendum Note (Signed)
Addended byConrad Baker D on: 03/11/2020 03:47 PM   Modules accepted: Orders

## 2021-01-27 ENCOUNTER — Encounter: Payer: BC Managed Care – PPO | Admitting: Internal Medicine

## 2021-01-28 ENCOUNTER — Encounter: Payer: Self-pay | Admitting: Internal Medicine

## 2021-01-28 ENCOUNTER — Other Ambulatory Visit: Payer: Self-pay

## 2021-01-28 ENCOUNTER — Ambulatory Visit (INDEPENDENT_AMBULATORY_CARE_PROVIDER_SITE_OTHER): Payer: BC Managed Care – PPO | Admitting: Internal Medicine

## 2021-01-28 VITALS — BP 136/82 | HR 95 | Temp 97.8°F | Ht 71.0 in | Wt 205.0 lb

## 2021-01-28 DIAGNOSIS — Z0000 Encounter for general adult medical examination without abnormal findings: Secondary | ICD-10-CM | POA: Diagnosis not present

## 2021-01-28 DIAGNOSIS — E785 Hyperlipidemia, unspecified: Secondary | ICD-10-CM | POA: Diagnosis not present

## 2021-01-28 LAB — COMPREHENSIVE METABOLIC PANEL
ALT: 66 U/L — ABNORMAL HIGH (ref 0–53)
AST: 43 U/L — ABNORMAL HIGH (ref 0–37)
Albumin: 4.8 g/dL (ref 3.5–5.2)
Alkaline Phosphatase: 102 U/L (ref 39–117)
BUN: 10 mg/dL (ref 6–23)
CO2: 27 mEq/L (ref 19–32)
Calcium: 9.5 mg/dL (ref 8.4–10.5)
Chloride: 105 mEq/L (ref 96–112)
Creatinine, Ser: 0.82 mg/dL (ref 0.40–1.50)
GFR: 101.58 mL/min (ref >60.00)
Glucose, Bld: 96 mg/dL (ref 70–99)
Potassium: 4 mEq/L (ref 3.5–5.1)
Sodium: 141 mEq/L (ref 135–145)
Total Bilirubin: 0.6 mg/dL (ref 0.2–1.2)
Total Protein: 7.9 g/dL (ref 6.0–8.3)

## 2021-01-28 LAB — LIPID PANEL
Cholesterol: 193 mg/dL (ref 0–200)
HDL: 55.4 mg/dL (ref >39.00)
LDL Cholesterol: 125 mg/dL — ABNORMAL HIGH (ref 0–99)
NonHDL: 137.44
Total CHOL/HDL Ratio: 3
Triglycerides: 64 mg/dL (ref 0.0–149.0)
VLDL: 12.8 mg/dL (ref 0.0–40.0)

## 2021-01-28 LAB — PSA: PSA: 2.84 ng/mL (ref 0.10–4.00)

## 2021-01-28 LAB — CBC WITH DIFFERENTIAL/PLATELET
Basophils Absolute: 0.1 10*3/uL (ref 0.0–0.1)
Basophils Relative: 1 % (ref 0.0–3.0)
Eosinophils Absolute: 0.1 10*3/uL (ref 0.0–0.7)
Eosinophils Relative: 1.8 % (ref 0.0–5.0)
HCT: 42.6 % (ref 39.0–52.0)
Hemoglobin: 15 g/dL (ref 13.0–17.0)
Lymphocytes Relative: 22.3 % (ref 12.0–46.0)
Lymphs Abs: 1.7 10*3/uL (ref 0.7–4.0)
MCHC: 35.2 g/dL (ref 30.0–36.0)
MCV: 93.5 fl (ref 78.0–100.0)
Monocytes Absolute: 0.5 10*3/uL (ref 0.1–1.0)
Monocytes Relative: 6.7 % (ref 3.0–12.0)
Neutro Abs: 5.1 10*3/uL (ref 1.4–7.7)
Neutrophils Relative %: 68.2 % (ref 43.0–77.0)
Platelets: 365 10*3/uL (ref 150.0–400.0)
RBC: 4.56 Mil/uL (ref 4.22–5.81)
RDW: 13.1 % (ref 11.5–15.5)
WBC: 7.5 10*3/uL (ref 4.0–10.5)

## 2021-01-28 MED ORDER — PRAVASTATIN SODIUM 20 MG PO TABS: 20.0000 mg | ORAL_TABLET | Freq: Every day | ORAL | 4 refills | Status: DC

## 2021-01-28 NOTE — Progress Notes (Signed)
Subjective:    Patient ID: Keith West, male    DOB: Jul 24, 1971, 50 y.o.   MRN: [id number]  DOS:  01/28/2021 Type of visit - description: CPX In general feels well. Reports he is having side effects from atorvastatin. Also hisschedule at work changed, working night shift, having some difficulties adapting.   Review of Systems  Other than above, a 14 point review of systems is negative     Past Medical History:  Diagnosis Date  . Allergy   . Anxiety 02/13/2014  . ED (erectile dysfunction)   . GERD (gastroesophageal reflux disease)   . Heart murmur 1999   ECHO NI 2005-dr paz had done  . Hypertension    Past hx   . Kidney stones 2000    Past Surgical History:  Procedure Laterality Date  . BUNIONECTOMY  1991   right  . DENTAL SURGERY    . SHOULDER ARTHROSCOPY WITH SUBACROMIAL DECOMPRESSION AND BICEP TENDON REPAIR Right 03/25/2013   Procedure: RIGHT SHOULDER ARTHROSCOPY WITH SUBACROMIAL DECOMPRESSION DISTAL CLAVICLE RESECTON LONG HEAD BICEP  TENDODESIS ;  Surgeon: Mable Paris, MD;  Location: [facility];  Service: Orthopedics;  Laterality: Right;    Allergies as of 01/28/2021      Reactions   Hydrocodone-acetaminophen Nausea And Vomiting      Medication List       Accurate as of January 28, 2021  1:54 PM. If you have any questions, ask your nurse or doctor.        STOP taking these medications   atorvastatin 10 MG tablet Commonly known as: LIPITOR Stopped by: Willow Ora, MD     TAKE these medications   amLODipine 10 MG tablet Commonly known as: NORVASC Take 1 tablet (10 mg total) by mouth daily.   multivitamin tablet Take 1 tablet by mouth daily. One  A Day Mens   omeprazole 20 MG tablet Commonly known as: PRILOSEC OTC Take 20 mg by mouth daily.   pravastatin 20 MG tablet Commonly known as: Pravachol Take 1 tablet (20 mg total) by mouth daily. Started by: Willow Ora, MD   zolpidem 10 MG tablet Commonly known as: AMBIEN Take 1  tablet (10 mg total) by mouth at bedtime as needed. for sleep          Objective:   Physical Exam BP 136/82 (BP Location: Left Arm, Patient Position: Sitting, Cuff Size: Large)   Pulse 95   Temp 97.8 F (36.6 C) (Oral)   Ht 5\' 11"  (1.803 m)   Wt 205 lb (93 kg)   SpO2 94%   BMI 28.59 kg/m  General: Well developed, NAD, BMI noted Neck: No  thyromegaly  HEENT:  Normocephalic . Face symmetric, atraumatic Lungs:  CTA B Normal respiratory effort, no intercostal retractions, no accessory muscle use. Heart: RRR,  no murmur.  Abdomen:  Not distended, soft, non-tender. No rebound or rigidity.   Lower extremities: no pretibial edema bilaterally  Skin: Exposed areas without rash. Not pale. Not jaundice Neurologic:  alert & oriented X3.  Speech normal, gait appropriate for age and unassisted Strength symmetric and appropriate for age.  Psych: Cognition and judgment appear intact.  Cooperative with normal attention span and concentration.  Behavior appropriate. No anxious or depressed appearing.     Assessment     Assessment HTN Dyslipidemia Anxiety Insomnia: Ambien as needed  GERD Heart murmur, echo normal 2005 Erectile dysfunction Urolithiasis Mildly elevated LFTs : Hep B-C (-) 2018   PLAN: Here for  CPX HTN: On amlodipine, ambulatory BPs when checked are normal High cholesterol:Started atorvastatin 01-2020 with good results.  Since then however he feels that in the morning he is a sluggish, "hangover".  No other symptoms.  I am not sure if that is a side effect but will switch to Pravachol after a washout period, see AVS, labs in 2 months. Anxiety, insomnia: Controlled, on Ambien as needed. RTC for labs 2 months RTC CPX 1 year   This visit occurred during the SARS-CoV-2 public health emergency.  Safety protocols were in place, including screening questions prior to the visit, additional usage of staff PPE, and extensive cleaning of exam room while observing  appropriate contact time as indicated for disinfecting solutions.

## 2021-01-28 NOTE — Assessment & Plan Note (Signed)
Here for CPX HTN: On amlodipine, ambulatory BPs when checked are normal High cholesterol:Started atorvastatin 01-2020 with good results.  Since then however he feels that in the morning he is a sluggish, "hangover".  No other symptoms.  I am not sure if that is a side effect but will switch to Pravachol after a washout period, see AVS, labs in 2 months. Anxiety, insomnia: Controlled, on Ambien as needed. RTC for labs 2 months RTC CPX 1 year

## 2021-01-28 NOTE — Patient Instructions (Addendum)
Stop taking atorvastatin, the cholesterol medication  In one  month, start taking Pravachol, I sent the prescription.   This is also cholesterol medication but is different. Take it every night, arrange for blood work a month after you start Pravachol.   Continue checking your blood pressure BP GOAL is between 110/65 and  135/85. If it is consistently higher or lower, let me know    GO TO THE LAB : Get the blood work     GO TO THE FRONT DESK, PLEASE SCHEDULE YOUR APPOINTMENTS Come back for a physical exam in 1 year

## 2021-01-28 NOTE — Assessment & Plan Note (Signed)
-  Td 1-18 - shingrix d/w pt , declined for now -Covid vaccine x 2, booster benefits d/w pt, plans to proceed - CCS: Had a colonoscopy 05-2019, next per GI. -prostate cancer screening --> DRE and PSA wnl last year, recheck a PSA - labs: CMP, FLP, PSA, CBC - diet is ok;  active at work but  unable to exercise regularly d/t schedule  -Tobacco abuse: Intolerant to Chantix, still working on quitting but not completely ready.

## 2021-01-29 ENCOUNTER — Other Ambulatory Visit: Payer: Self-pay | Admitting: Internal Medicine

## 2021-02-08 ENCOUNTER — Telehealth: Payer: Self-pay | Admitting: Internal Medicine

## 2021-02-08 NOTE — Telephone Encounter (Signed)
Requesting: Ambien 10mg   Contract: n/a UDS: n/a Last Visit: 01/28/21 Next Visit: 03/29/21 Last Refill: 01/28/2020 #30 and 3RF  Please Advise

## 2021-02-08 NOTE — Telephone Encounter (Signed)
PDMP okay, Rx sent 

## 2021-03-29 ENCOUNTER — Other Ambulatory Visit (INDEPENDENT_AMBULATORY_CARE_PROVIDER_SITE_OTHER): Payer: BC Managed Care – PPO

## 2021-03-29 ENCOUNTER — Other Ambulatory Visit: Payer: Self-pay

## 2021-03-29 DIAGNOSIS — Z0000 Encounter for general adult medical examination without abnormal findings: Secondary | ICD-10-CM | POA: Diagnosis not present

## 2021-03-29 DIAGNOSIS — E785 Hyperlipidemia, unspecified: Secondary | ICD-10-CM

## 2021-03-29 LAB — ALT: ALT: 49 U/L (ref 0–53)

## 2021-03-29 LAB — LIPID PANEL
Cholesterol: 168 mg/dL (ref 0–200)
HDL: 59.8 mg/dL (ref >39.00)
LDL Cholesterol: 77 mg/dL (ref 0–99)
NonHDL: 108.47
Total CHOL/HDL Ratio: 3
Triglycerides: 159 mg/dL — ABNORMAL HIGH (ref 0.0–149.0)
VLDL: 31.8 mg/dL (ref 0.0–40.0)

## 2021-03-29 LAB — AST: AST: 44 U/L — ABNORMAL HIGH (ref 0–37)

## 2021-03-31 MED ORDER — PRAVASTATIN SODIUM 20 MG PO TABS: 20.0000 mg | ORAL_TABLET | Freq: Every day | ORAL | 1 refills | Status: DC

## 2021-03-31 NOTE — Addendum Note (Signed)
Addended byConrad Arnold D on: 03/31/2021 09:42 AM   Modules accepted: Orders

## 2021-05-20 ENCOUNTER — Other Ambulatory Visit (HOSPITAL_COMMUNITY)
Admission: RE | Admit: 2021-05-20 | Discharge: 2021-05-20 | Disposition: A | Discharge status: Home / Self Care | Payer: BC Managed Care – PPO | Source: Ambulatory Visit | Attending: Internal Medicine | Admitting: Internal Medicine

## 2021-05-20 ENCOUNTER — Other Ambulatory Visit: Payer: Self-pay

## 2021-05-20 ENCOUNTER — Ambulatory Visit: Payer: BC Managed Care – PPO | Admitting: Internal Medicine

## 2021-05-20 ENCOUNTER — Encounter: Payer: Self-pay | Admitting: Internal Medicine

## 2021-05-20 VITALS — BP 136/80 | HR 90 | Temp 98.5°F | Resp 16 | Ht 71.0 in | Wt 199.4 lb

## 2021-05-20 DIAGNOSIS — Z202 Contact with and (suspected) exposure to infections with a predominantly sexual mode of transmission: Secondary | ICD-10-CM | POA: Diagnosis not present

## 2021-05-20 DIAGNOSIS — R319 Hematuria, unspecified: Secondary | ICD-10-CM

## 2021-05-20 LAB — URINALYSIS, ROUTINE W REFLEX MICROSCOPIC
Bilirubin Urine: NEGATIVE
Hgb urine dipstick: NEGATIVE
Ketones, ur: NEGATIVE
Leukocytes,Ua: NEGATIVE
Nitrite: NEGATIVE
RBC / HPF: NONE SEEN (ref 0–?)
Specific Gravity, Urine: >=1.03 — AB (ref 1.000–1.030)
Urine Glucose: NEGATIVE
Urobilinogen, UA: 0.2 (ref 0.0–1.0)
pH: 6 (ref 5.0–8.0)

## 2021-05-20 LAB — POC URINALSYSI DIPSTICK (AUTOMATED)
Blood, UA: NEGATIVE
Glucose, UA: NEGATIVE
Ketones, UA: NEGATIVE
Nitrite, UA: NEGATIVE
Protein, UA: POSITIVE — AB
Spec Grav, UA: >=1.03 — AB (ref 1.010–1.025)
Urobilinogen, UA: 0.2 E.U./dL
pH, UA: 6 (ref 5.0–8.0)

## 2021-05-20 NOTE — Progress Notes (Signed)
Subjective:    Patient ID: Keith West, male    DOB: [birth year], 50 y.o.   MRN: [id number]  DOS:  05/20/2021 Type of visit - description: acute  3 weeks ago, he had right flank pain, subsequently he passed a stone, where he passed the stone he had gross hematuria one time. No fever chills, no nausea or vomiting. Since then he is having some urinary frequency but no difficulty urinating or dysuria.  Also, wife was diagnosed with STD about a week ago, trichomonas?. The patient denies any genital rash or any discharge, urinary stream "sprays" but otherwise is not difficult to urinate.  Review of Systems See above   Past Medical History:  Diagnosis Date   Allergy    Anxiety 02/13/2014   ED (erectile dysfunction)    GERD (gastroesophageal reflux disease)    Heart murmur 1999   ECHO NI 2005-dr Macy Polio had done   Hypertension    Past hx    Kidney stones 2000    Past Surgical History:  Procedure Laterality Date   BUNIONECTOMY  1991   right   DENTAL SURGERY     SHOULDER ARTHROSCOPY WITH SUBACROMIAL DECOMPRESSION AND BICEP TENDON REPAIR Right 03/25/2013   Procedure: RIGHT SHOULDER ARTHROSCOPY WITH SUBACROMIAL DECOMPRESSION DISTAL CLAVICLE RESECTON LONG HEAD BICEP  TENDODESIS ;  Surgeon: Mable Paris, MD;  Location: [facility];  Service: Orthopedics;  Laterality: Right;    Allergies as of 05/20/2021       Reactions   Hydrocodone-acetaminophen Nausea And Vomiting        Medication List        Accurate as of May 20, 2021 10:48 AM. If you have any questions, ask your nurse or doctor.          amLODipine 10 MG tablet Commonly known as: NORVASC Take 1 tablet (10 mg total) by mouth daily.   multivitamin tablet Take 1 tablet by mouth daily. One  A Day Mens   omeprazole 20 MG tablet Commonly known as: PRILOSEC OTC Take 20 mg by mouth daily.   pravastatin 20 MG tablet Commonly known as: Pravachol Take 1 tablet (20 mg total) by mouth daily.    zolpidem 10 MG tablet Commonly known as: AMBIEN Take 1 tablet (10 mg total) by mouth at bedtime as needed. for sleep           Objective:   Physical Exam BP 136/80 (BP Location: Left Arm, Patient Position: Sitting, Cuff Size: Normal)   Pulse 90   Temp 98.5 F (36.9 C) (Oral)   Resp 16   Ht 5\' 11"  (1.803 m)   Wt 199 lb 6 oz (90.4 kg)   SpO2 98%   BMI 27.81 kg/m  General:   Well developed, NAD, BMI noted.  HEENT:  Normocephalic . Face symmetric, atraumatic Abdomen:  Not distended, soft, non-tender. No rebound or rigidity. GU: Scrotal contents normal, penis: No rash, no discharge.  No inguinal lymphadenopathies DRE: No stools, prostate is slightly enlarged but not tender or nodular. Skin: Not pale. Not jaundice Lower extremities: no pretibial edema bilaterally  Neurologic:  alert & oriented X3.  Speech normal, gait appropriate for age and unassisted Psych--  Cognition and judgment appear intact.  Cooperative with normal attention span and concentration.  Behavior appropriate. No anxious or depressed appearing.     Assessment      Assessment HTN Dyslipidemia Anxiety Insomnia: Ambien as needed  GERD Heart murmur, echo normal 2005 Erectile dysfunction Urolithiasis Mildly elevated  LFTs : Hep B-C (-) 2018   PLAN: Urolithiasis: Passage a stone 3 weeks ago, now asymptomatic.  Check a UA, urine culture. STD exposure: Wife diagnosed with trichomonas a week ago, the patient have some urinary frequency, unclear if related to recent stone passage or something else. Exam is benign, no evidence of prostatitis. Plan: swab  gonorrhea, chlamydia, blood work for hepatitis C, HIV, RPR. Advised patient to find exactly what was his wife's diagnosis.   This visit occurred during the SARS-CoV-2 public health emergency.  Safety protocols were in place, including screening questions prior to the visit, additional usage of staff PPE, and extensive cleaning of exam room while  observing appropriate contact time as indicated for disinfecting solutions.

## 2021-05-20 NOTE — Patient Instructions (Signed)
Go to the lab to get your blood work

## 2021-05-21 ENCOUNTER — Telehealth: Payer: Self-pay | Admitting: Internal Medicine

## 2021-05-21 ENCOUNTER — Encounter: Payer: Self-pay | Admitting: Internal Medicine

## 2021-05-21 LAB — URINE CULTURE
MICRO NUMBER:: 12119331
Result:: NO GROWTH
SPECIMEN QUALITY:: ADEQUATE

## 2021-05-21 LAB — CYTOLOGY, (ORAL, ANAL, URETHRAL) ANCILLARY ONLY
Chlamydia: NEGATIVE
Comment: NEGATIVE
Comment: NEGATIVE
Comment: NORMAL
Neisseria Gonorrhea: NEGATIVE
Trichomonas: POSITIVE — AB

## 2021-05-21 LAB — RPR: RPR Ser Ql: NONREACTIVE

## 2021-05-21 LAB — HEPATITIS C ANTIBODY
Hepatitis C Ab: NONREACTIVE
SIGNAL TO CUT-OFF: 0.22 (ref <1.00)

## 2021-05-21 LAB — HIV ANTIBODY (ROUTINE TESTING W REFLEX): HIV 1&2 Ab, 4th Generation: NONREACTIVE

## 2021-05-21 MED ORDER — METRONIDAZOLE 500 MG PO TABS: 2000.0000 mg | ORAL_TABLET | Freq: Once | ORAL | 0 refills | Status: AC

## 2021-05-21 MED ORDER — ZOLPIDEM TARTRATE 10 MG PO TABS: 10.0000 mg | ORAL_TABLET | Freq: Every evening | ORAL | 1 refills | Status: DC | PRN

## 2021-05-21 NOTE — Telephone Encounter (Signed)
PDMP okay, Rx sent 

## 2021-05-21 NOTE — Telephone Encounter (Signed)
Requesting: Ambien 10mg  Contract: 12/12/2018 UDS: None Last Visit: 05/21/2021 Next Visit: 01/31/2022 Last Refill: 02/08/2021 #30 and 0RF  Please Advise

## 2021-05-22 NOTE — Assessment & Plan Note (Signed)
Urolithiasis: Passage a stone 3 weeks ago, now asymptomatic.  Check a UA, urine culture. STD exposure: Wife diagnosed with trichomonas a week ago, the patient have some urinary frequency, unclear if related to recent stone passage or something else. Exam is benign, no evidence of prostatitis. Plan: swab  gonorrhea, chlamydia, blood work for hepatitis C, HIV, RPR. Advised patient to find exactly what was his wife's diagnosis.

## 2021-12-04 ENCOUNTER — Other Ambulatory Visit: Payer: Self-pay | Admitting: Internal Medicine

## 2022-01-31 ENCOUNTER — Encounter: Payer: Self-pay | Admitting: Internal Medicine

## 2022-01-31 ENCOUNTER — Ambulatory Visit (INDEPENDENT_AMBULATORY_CARE_PROVIDER_SITE_OTHER): Payer: 59 | Admitting: Internal Medicine

## 2022-01-31 VITALS — BP 126/80 | HR 98 | Temp 98.1°F | Resp 16 | Ht 71.0 in | Wt 201.5 lb

## 2022-01-31 DIAGNOSIS — E785 Hyperlipidemia, unspecified: Secondary | ICD-10-CM | POA: Diagnosis not present

## 2022-01-31 DIAGNOSIS — I10 Essential (primary) hypertension: Secondary | ICD-10-CM

## 2022-01-31 DIAGNOSIS — Z0000 Encounter for general adult medical examination without abnormal findings: Secondary | ICD-10-CM | POA: Diagnosis not present

## 2022-01-31 DIAGNOSIS — Z0189 Encounter for other specified special examinations: Secondary | ICD-10-CM | POA: Diagnosis not present

## 2022-01-31 LAB — COMPREHENSIVE METABOLIC PANEL
ALT: 43 U/L (ref 0–53)
AST: 38 U/L — ABNORMAL HIGH (ref 0–37)
Albumin: 4.5 g/dL (ref 3.5–5.2)
Alkaline Phosphatase: 117 U/L (ref 39–117)
BUN: 10 mg/dL (ref 6–23)
CO2: 26 mEq/L (ref 19–32)
Calcium: 9.1 mg/dL (ref 8.4–10.5)
Chloride: 105 mEq/L (ref 96–112)
Creatinine, Ser: 0.88 mg/dL (ref 0.40–1.50)
GFR: 98.73 mL/min (ref >60.00)
Glucose, Bld: 89 mg/dL (ref 70–99)
Potassium: 3.8 mEq/L (ref 3.5–5.1)
Sodium: 139 mEq/L (ref 135–145)
Total Bilirubin: 0.6 mg/dL (ref 0.2–1.2)
Total Protein: 7.4 g/dL (ref 6.0–8.3)

## 2022-01-31 LAB — CBC WITH DIFFERENTIAL/PLATELET
Basophils Absolute: 0.1 10*3/uL (ref 0.0–0.1)
Basophils Relative: 1 % (ref 0.0–3.0)
Eosinophils Absolute: 0.2 10*3/uL (ref 0.0–0.7)
Eosinophils Relative: 3.6 % (ref 0.0–5.0)
HCT: 47.2 % (ref 39.0–52.0)
Hemoglobin: 16.4 g/dL (ref 13.0–17.0)
Lymphocytes Relative: 21.5 % (ref 12.0–46.0)
Lymphs Abs: 1.4 10*3/uL (ref 0.7–4.0)
MCHC: 34.8 g/dL (ref 30.0–36.0)
MCV: 96.3 fl (ref 78.0–100.0)
Monocytes Absolute: 0.5 10*3/uL (ref 0.1–1.0)
Monocytes Relative: 7.7 % (ref 3.0–12.0)
Neutro Abs: 4.4 10*3/uL (ref 1.4–7.7)
Neutrophils Relative %: 66.2 % (ref 43.0–77.0)
Platelets: 315 10*3/uL (ref 150.0–400.0)
RBC: 4.9 Mil/uL (ref 4.22–5.81)
RDW: 13 % (ref 11.5–15.5)
WBC: 6.6 10*3/uL (ref 4.0–10.5)

## 2022-01-31 LAB — LIPID PANEL
Cholesterol: 179 mg/dL (ref 0–200)
HDL: 61.1 mg/dL (ref >39.00)
LDL Cholesterol: 94 mg/dL (ref 0–99)
NonHDL: 117.98
Total CHOL/HDL Ratio: 3
Triglycerides: 119 mg/dL (ref 0.0–149.0)
VLDL: 23.8 mg/dL (ref 0.0–40.0)

## 2022-01-31 NOTE — Assessment & Plan Note (Signed)
Here for CPX ?HTN: Seems well controlled, continue amlodipine. ?Dyslipidemia: Reports a headache with Pravachol, he is only taking it now and then. Previously atorvastatin caused "a hangover". ?Recheck labs, referred to a dietitian per patient request.  Consider rechallenge with statins. ?Insomnia: Takes Ambien rarely.  RF is needed ?Anxiety:not a major issue at this point, he did state that he has some stress, work-related ?Snoring: Snores on and off, Epworth scale 11, + screening.  Referred to neurology for possible OSA rule out. ?Thyromegaly: Mild per physical exam today, thyroid US 12/20/2018, nonspecific enlargement. ?RTC 6 months ?

## 2022-01-31 NOTE — Patient Instructions (Signed)
Check the  blood pressure regularly ?BP GOAL is between 110/65 and  135/85. ?If it is consistently higher or lower, let me know ? ?We will refer you to neurology for snoring evaluation ? ? ?GO TO THE LAB : Get the blood work   ? ? ?GO TO THE FRONT DESK, PLEASE SCHEDULE YOUR APPOINTMENTS ?Come back for a checkup in 6 months ? ? ? ?"Living will", "Health Care Power of attorney": Advanced care planning ? ?(If you already have a living will or healthcare power of attorney, please bring the copy to be scanned in your chart.) ? ?Advance care planning is a process that supports adults in  understanding and sharing their preferences regarding future medical care.  ? ?The patient's preferences are recorded in documents called Advance Directives.    ?Advanced directives are completed (and can be modified at any time) while the patient is in full mental capacity.  ? ?The documentation should be available at all times to the patient, the family and the healthcare providers.  ?Bring in a copy to be scanned in your chart is an excellent idea and is recommended  ? ?This legal documents direct treatment decision making and/or appoint a surrogate to make the decision if the patient is not capable to do so.  ? ? ?Advance directives can be documented in many types of formats,  documents have names such as:  ?Lliving will  ?Durable power of attorney for healthcare (healthcare proxy or healthcare power of attorney)  ?Combined directives  ?Physician orders for life-sustaining treatment  ?  ?More information at: ? ?[email or web address]  ?

## 2022-01-31 NOTE — Progress Notes (Signed)
? ?Subjective:  ? ? Patient ID: Keith West, male    DOB: 03-13-1971, 51 y.o.   MRN: [id number] ? ?DOS:  01/31/2022 ?Type of visit - description: cpx ? ?Here for CPX.  Patient has no major concerns but his wife is concerned because patient snores.  He denies fatigue per se ? ? Review of Systems ? ?Other than above, a 14 point review of systems is negative  ? ?  ? ?Past Medical History:  ?Diagnosis Date  ? Allergy   ? Anxiety 02/13/2014  ? ED (erectile dysfunction)   ? GERD (gastroesophageal reflux disease)   ? Heart murmur 1999  ? ECHO NI 2005-dr Cydne Grahn had done  ? Hypertension   ? Past hx   ? Kidney stones 2000  ? ? ?Past Surgical History:  ?Procedure Laterality Date  ? BUNIONECTOMY  1991  ? right  ? DENTAL SURGERY    ? SHOULDER ARTHROSCOPY WITH SUBACROMIAL DECOMPRESSION AND BICEP TENDON REPAIR Right 03/25/2013  ? Procedure: RIGHT SHOULDER ARTHROSCOPY WITH SUBACROMIAL DECOMPRESSION DISTAL CLAVICLE RESECTON LONG HEAD BICEP  TENDODESIS ;  Surgeon: Mable Paris, MD;  Location: [facility];  Service: Orthopedics;  Laterality: Right;  ? ?Social History  ? ?Socioeconomic History  ? Marital status: Married  ?  Spouse name: Not on file  ? Number of children: 3  ? Years of education: Not on file  ? Highest education level: Not on file  ?Occupational History  ? Occupation: electrician  ?Tobacco Use  ? Smoking status: Some Days  ? Smokeless tobacco: Never  ? Tobacco comments:  ?  4-5 cigarrets qd   ?Substance and Sexual Activity  ? Alcohol use: Yes  ?  Comment: socially  ? Drug use: No  ? Sexual activity: Not on file  ?Other Topics Concern  ? Not on file  ?Social History Narrative  ? Household-- pt, wife, 1 daughter    ? 3 children, 2 living , 8 Gkids  ? Lost 60 y/o child (cerebral palsy) ~ 11-2016  ? ?Social Determinants of Health  ? ?Financial Resource Strain: Not on file  ?Food Insecurity: Not on file  ?Transportation Needs: Not on file  ?Physical Activity: Not on file  ?Stress: Not on file  ?Social  Connections: Not on file  ?Intimate Partner Violence: Not on file  ? ? ? ?Current Outpatient Medications  ?Medication Instructions  ? amLODipine (NORVASC) 10 mg, Oral, Daily  ? Multiple Vitamin (MULTIVITAMIN) tablet 1 tablet, Oral, Daily, One  A Day Mens   ? omeprazole (PRILOSEC OTC) 20 mg, Oral, Daily  ? zolpidem (AMBIEN) 10 mg, Oral, At bedtime PRN, for sleep  ? ? ?   ?Objective:  ? Physical Exam ?BP 126/80 (BP Location: Left Arm, Patient Position: Sitting, Cuff Size: Small)   Pulse 98   Temp 98.1 ?F (36.7 ?C) (Oral)   Resp 16   Ht 5\' 11"  (1.803 m)   Wt 201 lb 8 oz (91.4 kg)   SpO2 98%   BMI 28.10 kg/m?  ?General: ?Well developed, NAD, BMI noted ?Neck: Slightly enlarged thyroid gland, not tender, not nodular. ?HEENT:  ?Normocephalic . Face symmetric, atraumatic ?Lungs:  ?CTA B ?Normal respiratory effort, no intercostal retractions, no accessory muscle use. ?Heart: RRR,  no murmur.  ?Abdomen:  ?Not distended, soft, non-tender. No rebound or rigidity.   ?Lower extremities: no pretibial edema bilaterally ?DRE: Normal sphincter tone, prostate slightly enlarged but otherwise normal.  No stools ?Skin: Exposed areas without rash. Not pale. Not  jaundice ?Neurologic:  ?alert & oriented X3.  ?Speech normal, gait appropriate for age and unassisted ?Strength symmetric and appropriate for age.  ?Psych: ?Cognition and judgment appear intact.  ?Cooperative with normal attention span and concentration.  ?Behavior appropriate. ?No anxious or depressed appearing. ? ?   ?Assessment   ? ?  ?Assessment ?HTN ?Dyslipidemia ?Anxiety ?Insomnia: Ambien as needed  ?GERD ?Heart murmur, echo normal 2005 ?Erectile dysfunction ?Urolithiasis ?Mildly elevated LFTs : Hep B-C (-) 2018  ? ?PLAN: ?Here for CPX ?HTN: Seems well controlled, continue amlodipine. ?Dyslipidemia: Reports a headache with Pravachol, he is only taking it now and then. Previously atorvastatin caused "a hangover". ?Recheck labs, referred to a dietitian per patient  request.  Consider rechallenge with statins. ?Insomnia: Takes Ambien rarely.  RF is needed ?Anxiety:not a major issue at this point, he did state that he has some stress, work-related ?Snoring: Snores on and off, Epworth scale 11, + screening.  Referred to neurology for possible OSA rule out. ?Thyromegaly: Mild per physical exam today, thyroid US 12/20/2018, nonspecific enlargement. ?RTC 6 months ? ?  ?  ?This visit occurred during the SARS-CoV-2 public health emergency.  Safety protocols were in place, including screening questions prior to the visit, additional usage of staff PPE, and extensive cleaning of exam room while observing appropriate contact time as indicated for disinfecting solutions.  ? ?

## 2022-01-31 NOTE — Assessment & Plan Note (Signed)
-   Td 1-18 ?- shingrix d/w pt   ?-Covid vaccine - booster benefits d/w pt  ?- CCS: Had a colonoscopy 05-2019, next 10 years per Colonoscopy report ?-prostate cancer screening --> no sxs, dre wnl, check a PSA ?- labs: CMP, FLP, CBC, TSH, PSA ?-Diet exercise: Discussed ?-Tobacco abuse: Intolerant to Chantix, still working on quitting but not completely ready. ?  ?

## 2022-02-01 LAB — PSA: PSA: 3.43 ng/mL (ref 0.10–4.00)

## 2022-02-01 LAB — TSH: TSH: 1.03 u[IU]/mL (ref 0.35–5.50)

## 2022-02-12 ENCOUNTER — Other Ambulatory Visit: Payer: Self-pay | Admitting: Internal Medicine

## 2022-04-07 ENCOUNTER — Institutional Professional Consult (permissible substitution): Payer: 59 | Admitting: Neurology

## 2022-04-13 ENCOUNTER — Ambulatory Visit: Payer: 59 | Admitting: Dietician

## 2022-04-14 ENCOUNTER — Encounter: Payer: Self-pay | Admitting: Registered"

## 2022-04-14 ENCOUNTER — Encounter: Payer: 59 | Attending: Internal Medicine | Admitting: Registered"

## 2022-04-14 DIAGNOSIS — E7849 Other hyperlipidemia: Secondary | ICD-10-CM | POA: Diagnosis present

## 2022-04-14 NOTE — Patient Instructions (Signed)
Instructions/Goals:   Make sure to get in three meals per day. Try to have balanced meals like the My Plate example (see handout). Include lean proteins, vegetables, fruits, and whole grains at meals.  Continue working to have 3 meals/eat every 3-5 hours.  Include half plate non-starchy vegetables at lunch and dinner and fruit at breakfast/snacks 2 times daily  Continue working in whole grains  Heart Health:  Limit saturated fats-look for those with around 5% or less daily value and limit those with around 20% or more  Be mindful of sodium intake-limit those with 20% or more daily value  Prepare most meals at home-restaurant foods tend to be higher in saturated fats and sodium.  Include whole grains, vegetables and fruits (see handout)  Include physical activity at least 150 minutes spread between at least 4-5 days of the week   Water Goal: At least 80 oz daily-Doing great!   Healthy Recipes: [email or web address]  Physical Activity Goal: Make physical activity a part of your week. Try to include at least 30 minutes of physical activity 5 days each week or at least 150 minutes per week. Regular physical activity promotes overall health-including helping to reduce risk for heart disease and diabetes, promoting mental health, and helping [place] sleep better.

## 2022-04-14 NOTE — Progress Notes (Signed)
Medical Nutrition Therapy:  Appt start time: [id number] end time:  1000.  Assessment:  Primary concerns today: Pt referred due to HLD. Pt present for appointment alone.  Pt reports he has tried 2 different cholesterol medications but had side effects (headaches) so he was unable to continue them. Pt wants to try making dietary changes instead to improve levels.   Pt reports his eating schedule varies depending on his work day. Reports on a normal work day, eats 2 "decent" meals. 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 Pt gets 45 minutes for lunch (tries to keep it around 1-3 PM) and 15 minutes every 4 hours. Pt has been working in this field ~30 years and 15 with current company. On off days pt eats 3 meals and some snacks. Typically at 8-930 AM has breakfast, 1230 light lunch, 4-6 PM dinner. Reports meals are 50/50 between home cooked and out. 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   Social/Other: Pt lives with his wife, daughter, and often his niece.   Food Allergies/Intolerances: None reported.   GI Concerns: Reflux. Reports being managed well right now. Pt feels he has a good handle on problem foods.   Pertinent Lab Values: 01/31/22:  AST: 38  Preferred Learning Style:  No preference indicated   Learning Readiness:  Ready  MEDICATIONS: See list. Reviewed. Supplement: MVI   DIETARY INTAKE:  Usual eating pattern includes 2-3 meals and some snacks per day.   Common foods: N/A.  Avoided foods: liver, okra, fried foods, very spicy foods.    Typical Snacks: fruit, sweet, varies.   Typical Beverages: 1-2 cups sweet tea (mostly when eating out), ~1 gallon water. Reports he has tried to increase water intake.   Location of Meals: Together at home.   Electronics Present at [employer]: N/A  24-hr recall: Workday  B (930-10  AM): leftover pepperoni pizza x 1 slice, water, cup coffee (a little  creamer and 2 tbsp sugar)    Snk (AM):  None reported.  L (230 PM): Marie Callenders [place] potpie, Gingerale  Snk ( PM): 1 snickers snack size D (~9 PM): cheeseburger on bun with mayo, mustard, water  Snk ( PM): None reported.  Beverages: water, Gingerale, 1 cup coffee   Usual physical activity: yard work; gym tries for 3 days per week but hasn't been working well lately. Tries to go more on off days.     Progress Towards Goal(s):  In progress.   Nutritional Diagnosis:  NB-1.1 Food and nutrition-related knowledge deficit As related to no prior education by dietitian.  As evidenced by pt referred for nutrition counseling.    Intervention:  Nutrition counseling provided. Reviewed pertinent labs-discussed cholesterol currently WNL but with upward trend. Being proactive may help prevent further upward trend. Provided education regarding balanced and heart healthy nutrition and label reading. Pt appeared agreeable to information/goals discussed.  Instructions/Goals:   Make sure to get in three meals per day. Try to have balanced meals like the My Plate example (see handout). Include lean proteins, vegetables, fruits, and whole grains at meals.  Continue working to have 3 meals/eat every 3-5 hours.  Include half plate non-starchy vegetables at lunch and dinner and fruit at breakfast/snacks 2 times daily  Continue working in whole grains  Heart Health:  Limit saturated fats-look for those with around 5% or less daily value and  limit those with around 20% or more  Be mindful of sodium intake-limit those with 20% or more daily value  Prepare most meals at home-restaurant foods tend to be higher in saturated fats and sodium.  Include whole grains, vegetables and fruits (see handout)  Include physical activity at least 150 minutes spread between at least 4-5 days of the week   Water Goal: At least 80 oz daily-Doing great!   Healthy  Recipes: [email or web address]  Physical Activity Goal: Make physical activity a part of your week. Try to include at least 30 minutes of physical activity 5 days each week or at least 150 minutes per week. Regular physical activity promotes overall health-including helping to reduce risk for heart disease and diabetes, promoting mental health, and helping [place] sleep better.     Teaching Method Utilized:  Visual Auditory  Handouts given during visit include: Balanced plate and food list.   Barriers to learning/adherence to lifestyle change: None reported.   Demonstrated degree of understanding via:  Teach Back   Monitoring/Evaluation:  Dietary intake, exercise, and body weight in 4 month(s).

## 2022-05-05 ENCOUNTER — Ambulatory Visit (INDEPENDENT_AMBULATORY_CARE_PROVIDER_SITE_OTHER): Payer: 59 | Admitting: Neurology

## 2022-05-05 ENCOUNTER — Encounter: Payer: Self-pay | Admitting: Neurology

## 2022-05-05 VITALS — BP 158/99 | HR 104 | Ht 71.0 in | Wt 200.8 lb

## 2022-05-05 DIAGNOSIS — R030 Elevated blood-pressure reading, without diagnosis of hypertension: Secondary | ICD-10-CM | POA: Diagnosis not present

## 2022-05-05 DIAGNOSIS — R351 Nocturia: Secondary | ICD-10-CM

## 2022-05-05 DIAGNOSIS — G4719 Other hypersomnia: Secondary | ICD-10-CM

## 2022-05-05 DIAGNOSIS — R0681 Apnea, not elsewhere classified: Secondary | ICD-10-CM

## 2022-05-05 DIAGNOSIS — E663 Overweight: Secondary | ICD-10-CM | POA: Diagnosis not present

## 2022-05-05 DIAGNOSIS — Z820 Family history of epilepsy and other diseases of the nervous system: Secondary | ICD-10-CM

## 2022-05-05 NOTE — Patient Instructions (Signed)

## 2022-05-05 NOTE — Progress Notes (Signed)
Subjective:    Patient ID: Keith West is a 51 y.o. male.  HPI    Star Age, MD, PhD [facility] [address], [address] [address] [place], [place] [address]  Dear Dr. Larose Kells,   I saw your patient, Keith West, upon your kind request in my sleep clinic today for initial consultation of his sleep disorder, in particular, concern for underlying obstructive sleep apnea.  The patient is unaccompanied today.  As you know, Mr. Keith West is a 51 year old right-handed gentleman with an underlying medical history of hypertension, hyperlipidemia, reflux disease, anxiety, allergies, kidney stones, and overweight state, who reports snoring and excessive daytime somnolence.  His wife has mentioned pauses in his breathing to him and sometimes elbows him or not just him at night.  She has sleep apnea and uses a CPAP or similar machine and has done well with it.  He is familiar with treating sleep apnea with a CPAP.  He has a nephew with sleep apnea who uses a CPAP as well.  Patient is working on smoking cessation.  He has reduced his cigarette smoking to 8 to 10 cigarettes/day.  He drinks alcohol occasionally, maybe twice a week.  He drinks caffeine in the form of coffee, 1 or 2 servings per day and tea, 1 or 7 servings per day on average.  He does not drink soda daily, he has recently started going to the gym on a regular basis and when he goes to the gym he gets up at 4:30 AM.  For work, he has to be up at 5:45 AM.  He works 12-hour shifts from 7 AM to 7 PM, 3 days on, 1 day off, 3 days on and 3 days off and so forth.  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  He lives with his wife and 15 year old daughter, he also has a 52 year old son.  He has nocturia about once per average night, denies recurrent nocturnal or morning headaches but lately has had some slight headaches during the day. I reviewed your office note from 01/31/2022.  His Epworth sleepiness score is 12 out  of 24, fatigue severity score is 23 out of 63.  Of note, his blood pressure is elevated today and he admits that he had not taken his amlodipine this morning.  Weight has been more or less the same, he had gained some weight around the time of his shoulder surgery in the past but then was able to lose it again.  His Past Medical History Is Significant For: Past Medical History:  Diagnosis Date   Allergy    Anxiety 02/13/2014   ED (erectile dysfunction)    GERD (gastroesophageal reflux disease)    Heart murmur 1999   ECHO NI 2005-dr paz had done   Hyperlipidemia    Hypertension    Past hx    Kidney stones 2000    His Past Surgical History Is Significant For: Past Surgical History:  Procedure Laterality Date   BUNIONECTOMY  1991   right   DENTAL SURGERY     SHOULDER ARTHROSCOPY WITH SUBACROMIAL DECOMPRESSION AND BICEP TENDON REPAIR Right 03/25/2013   Procedure: RIGHT SHOULDER ARTHROSCOPY WITH SUBACROMIAL DECOMPRESSION DISTAL CLAVICLE RESECTON LONG HEAD BICEP  TENDODESIS ;  Surgeon: Nita Sells, MD;  Location: [place];  Service: Orthopedics;  Laterality: Right;    His Family History Is Significant For: Family History  Problem Relation Age of Onset   Hyperlipidemia Mother  Hypertension Mother        F and M   Colon polyps Mother    Hyperlipidemia Father    Alcohol abuse Father    Lung cancer Father    Breast cancer Sister    Stroke Paternal Aunt    Dementia Maternal Grandmother    Diabetes Other        M&F family   Sleep apnea Nephew    Coronary artery disease Neg Hx    Colon cancer Neg Hx    Prostate cancer Neg Hx    CAD Neg Hx    Esophageal cancer Neg Hx    Rectal cancer Neg Hx    Stomach cancer Neg Hx     His Social History Is Significant For: Social History   Socioeconomic History   Marital status: Married    Spouse name: Not on file   Number of children: 3   Years of education: Not on file   Highest education level: Not on  file  Occupational History   Occupation: electrician  Tobacco Use   Smoking status: Every Day    Packs/day: 0.50    Types: Cigarettes   Smokeless tobacco: Never   Tobacco comments:    4-5 cigarrets qd   Substance and Sexual Activity   Alcohol use: Not Currently    Alcohol/week: 5.0 standard drinks of alcohol    Types: 5 Shots of liquor per week    Comment: beer on occasion   Drug use: No   Sexual activity: Not on file  Other Topics Concern   Not on file  Social History Narrative   Household-- pt, wife, 1 daughter     3 children, 2 living , 8 Gkids   Lost 23 y/o child (cerebral palsy) ~ 11-2016   Social Determinants of Health   Financial Resource Strain: Not on file  Food Insecurity: No Food Insecurity (04/14/2022)   Hunger Vital Sign    Worried About Running Out of Food in the Last Year: Never true    Ran Out of Food in the Last Year: Never true  Transportation Needs: Not on file  Physical Activity: Not on file  Stress: Not on file  Social Connections: Not on file    His Allergies Are:  Allergies  Allergen Reactions   Hydrocodone-Acetaminophen Nausea And Vomiting  :   His Current Medications Are:  Outpatient Encounter Medications as of 05/05/2022  Medication Sig   amLODipine (NORVASC) 10 MG tablet TAKE 1 TABLET BY MOUTH EVERY DAY   Multiple Vitamin (MULTIVITAMIN) tablet Take 1 tablet by mouth daily. One  A Day Mens   omeprazole (PRILOSEC OTC) 20 MG tablet Take 20 mg by mouth daily.   zolpidem (AMBIEN) 10 MG tablet Take 1 tablet (10 mg total) by mouth at bedtime as needed. for sleep   No facility-administered encounter medications on file as of 05/05/2022.  :   Review of Systems:  Out of a complete 14 point review of systems, all are reviewed and negative with the exception of these symptoms as listed below:  Review of Systems  Neurological:        Pt here for Sleep Consult  Pt states snores,headaches,fatigue,hypertension. Pt denies sleep study,CPAP machine     ESS:12 FSS;23    Objective:  Neurological Exam  Physical Exam Physical Examination:   Vitals:   05/05/22 1225  BP: (!) 158/99  Pulse: (!) 104    General Examination: The patient is a very pleasant 51 y.o. male in no  acute distress. He appears well-developed and well-nourished and well groomed.   HEENT: Normocephalic, atraumatic, pupils are equal, round and reactive to light, extraocular tracking is good without limitation to gaze excursion or nystagmus noted. Hearing is grossly intact. Face is symmetric with normal facial animation. Speech is clear with no dysarthria noted. There is no hypophonia. There is no lip, neck/head, jaw or voice tremor. Neck is supple with full range of passive and active motion. There are no carotid bruits on auscultation. Oropharynx exam reveals: mild mouth dryness, adequate dental hygiene with some missing teeth, moderate airway crowding secondary to large uvula and tonsillar size of about 1-2+ bilaterally, wider tongue.  Mallampati class III.  Neck circumference 16-1/4 inches.  Mild overbite noted.  Tongue protrudes centrally and palate elevates symmetrically.    Chest: Clear to auscultation without wheezing, rhonchi or crackles noted.  Heart: S1+S2+0, regular and normal without murmurs, rubs or gallops noted.   Abdomen: Soft, non-tender and non-distended.  Extremities: There is no obvious edema in the distal lower extremities bilaterally.   Skin: Warm and dry without trophic changes noted.   Musculoskeletal: exam reveals no obvious joint deformities.   Neurologically:  Mental status: The patient is awake, alert and oriented in all 4 spheres. His immediate and remote memory, attention, language skills and fund of knowledge are appropriate. There is no evidence of aphasia, agnosia, apraxia or anomia. Speech is clear with normal prosody and enunciation. Thought process is linear. Mood is normal and affect is normal.  Cranial nerves II - XII are as  described above under HEENT exam.  Motor exam: Normal bulk, strength and tone is noted. There is no obvious tremor.  Fine motor skills and coordination: grossly intact.  Cerebellar testing: No dysmetria or intention tremor. There is no truncal or gait ataxia.  Sensory exam: intact to light touch in the upper and lower extremities.  Gait, station and balance: He stands easily. No veering to one side is noted. No leaning to one side is noted. Posture is age-appropriate and stance is narrow based. Gait shows normal stride length and normal pace. No problems turning are noted.   Assessment and Plan:  In summary, Aadyn Buchheit Petrow is a very pleasant 51 y.o.-year old male with an underlying medical history of hypertension, hyperlipidemia, reflux disease, anxiety, allergies, kidney stones, and overweight state, whose history and physical exam are concerning for obstructive sleep apnea (OSA). I had a long chat with the patient about my findings and the diagnosis of OSA, its prognosis and treatment options. We talked about medical treatments, surgical interventions and non-pharmacological approaches. I explained in particular the risks and ramifications of untreated moderate to severe OSA, especially with respect to developing cardiovascular disease down the Road, including congestive heart failure, difficult to treat hypertension, cardiac arrhythmias, or stroke. Even type 2 diabetes has, in part, been linked to untreated OSA. Symptoms of untreated OSA include daytime sleepiness, memory problems, mood irritability and mood disorder such as depression and anxiety, lack of energy, as well as recurrent headaches, especially morning headaches. We talked about complete smoking cessation and trying to maintain a healthy lifestyle in general, as well as the importance of weight control. We also talked about the importance of good sleep hygiene. I recommended the following at this time: sleep study.  I outlined the  differences between a laboratory attended sleep study versus home sleep testing.  I explained the sleep test procedure to the patient and also outlined possible surgical and non-surgical treatment options  of OSA, including the use of a custom-made dental device (which would require a referral to a specialist dentist or oral surgeon), upper airway surgical options, such as traditional UPPP or a novel less invasive surgical option in the form of Inspire hypoglossal nerve stimulation (which would involve a referral to an ENT surgeon). I also explained the CPAP treatment option to the patient, who indicated that he would be willing to try PAP if the need arises. I explained the importance of being compliant with PAP treatment, not only for insurance purposes but primarily to improve His symptoms, and for the patient's long term health benefit, including to reduce His cardiovascular risks. I answered all his questions today and the patient was in agreement. I plan to see him back after the sleep study is completed and encouraged him to call with any interim questions, concerns, problems or updates.   Thank you very much for allowing me to participate in the care of this nice patient. If I can be of any further assistance to you please do not hesitate to call me at [phone].  Sincerely,   Huston Foley, MD, PhD

## 2022-05-09 ENCOUNTER — Telehealth: Payer: Self-pay | Admitting: Neurology

## 2022-05-09 NOTE — Telephone Encounter (Signed)
unable to leave vmail the mail box is full  [employer] no Berkley Harvey req spoke to [place] [facility] ref # [id number]

## 2022-05-17 NOTE — Telephone Encounter (Signed)
LVM for pt to call back to schedule.

## 2022-05-31 NOTE — Telephone Encounter (Signed)
Patient returned my call.  [employer]- [employer] no auth req spoke to [place] [facility] ref # [id number]. He is scheduled at [facility] for 06/13/22 at 8 pm.  I mailed packet to the patient.

## 2022-06-13 ENCOUNTER — Ambulatory Visit (INDEPENDENT_AMBULATORY_CARE_PROVIDER_SITE_OTHER): Payer: 59 | Admitting: Neurology

## 2022-06-13 DIAGNOSIS — G4720 Circadian rhythm sleep disorder, unspecified type: Secondary | ICD-10-CM

## 2022-06-13 DIAGNOSIS — G4733 Obstructive sleep apnea (adult) (pediatric): Secondary | ICD-10-CM | POA: Diagnosis not present

## 2022-06-13 DIAGNOSIS — Z820 Family history of epilepsy and other diseases of the nervous system: Secondary | ICD-10-CM

## 2022-06-13 DIAGNOSIS — R030 Elevated blood-pressure reading, without diagnosis of hypertension: Secondary | ICD-10-CM

## 2022-06-13 DIAGNOSIS — E663 Overweight: Secondary | ICD-10-CM

## 2022-06-13 DIAGNOSIS — G4719 Other hypersomnia: Secondary | ICD-10-CM

## 2022-06-13 DIAGNOSIS — R0681 Apnea, not elsewhere classified: Secondary | ICD-10-CM

## 2022-06-13 DIAGNOSIS — R351 Nocturia: Secondary | ICD-10-CM

## 2022-06-23 NOTE — Procedures (Signed)
[facility] POLYSOMNOGRAPHY  INTERPRETATION REPORT   STUDY DATE:  06/13/2022     PATIENT NAME:  Keith West         DATE OF BIRTH:  [birth year]  PATIENT ID:  [id number]    TYPE OF STUDY:  PSG  READING PHYSICIAN: Huston Foley, MD, PhD SCORING TECHNICIAN: Sheena Fields   INDICATIONS and History:  51 year old right-handed gentleman with an underlying medical history of hypertension, hyperlipidemia, reflux disease, anxiety, allergies, kidney stones, and overweight state, who reports snoring and excessive daytime somnolence. The Epworth Sleepiness Score was 12 out of 24 (scores above or equal to 10 are suggestive of hypersomnolence).  DESCRIPTION: A sleep technologist was in attendance for the duration of the recording.  Data collection, scoring, video monitoring, and reporting were performed in compliance with the AASM Manual for the Scoring of Sleep and Associated Events; (Hypopnea is scored based on the criteria listed in Section VIII D. 1b in the AASM Manual V2.6 using a 4% oxygen desaturation rule or Hypopnea is scored based on the criteria listed in Section VIII D. 1a in the AASM Manual V2.6 using 3% oxygen desaturation and /or arousal rule).   ADDITIONAL INFORMATION:  Height: 71 in Weight: 200 lb (BMI 27) Neck Size: 16 in Medications: Norvasc, Multivitamin, Prilosec, Ambien  FINDINGS:  Please refer to the attached summary for additional quantitative information.  SLEEP CONTINUITY AND SLEEP ARCHITECTURE:  Lights off was at 20:54: and lights on 05:06: (8.2 hours in bed). Total sleep time was 295.5 minutes (51.4% supine;  37.2% lateral;  11% prone, 23.2% REM sleep), with a decreased sleep efficiency at 60.2%. Sleep latency was increased at 114.5 minutes.  REM sleep latency was normal at 96.0 minutes. Of the total sleep time, the percentage of stage N1 sleep was 19.5%, which is increased, stage N2 sleep was 52%, stage N3 sleep was 5.1%, and REM sleep was 23.2%,  which is normal. There were 4 Stage R periods observed on this study night, 40 awakenings (i.e. transitions to Stage W from any sleep stage), and 122 total stage transitions. Wake after sleep onset (WASO) time accounted for 81 min, which is increased, with mild to moderate sleep fragmentation noted.  AROUSAL: There were 90 arousals in total, for an arousal index of 18 arousals/hour.  Of these, 47 were identified as respiratory-related arousals (10 /hr), 0 were PLM-related arousals (0 /hr), and 66 were non-specific arousals (13 /hr)  RESPIRATORY MONITORING:  Based on CMS criteria (using a 4% oxygen desaturation rule for scoring hypopneas), there were 0 apneas (0 obstructive; 0 central; 0 mixed), and 54 hypopneas.  Apnea index was 0.0. Hypopnea index was 11.0. The apnea-hypopnea index was 11.0 overall (20.1 supine, 0 non-supine; 0.0 REM, 0.0 supine REM). There were 0 respiratory effort-related arousals (RERAs).  The RERA index was 0 events/hr. Total respiratory disturbance index (RDI) was 11.0 events/hr. RDI results showed: supine RDI  20.1 /hr; non-supine RDI 1.3 /hr; REM RDI 0.0 /hr, supine REM RDI 0.0 /hr.   Based on AASM criteria (using a 3% oxygen desaturation and /or arousal rule for scoring hypopneas), there were 0 apneas (0 obstructive; 0 central; 0 mixed), and 71 hypopneas. Apnea index was 0.0. Hypopnea index was 14.4. The apnea-hypopnea index was 14.4/hour overall (24.9 supine, 7 non-supine; 6.1 REM, 4.5 supine REM). There were 0 respiratory effort-related arousals (RERAs).  The RERA index was 0 events/hr. Total respiratory disturbance index (RDI) was 14.4 events/hr. RDI results showed: supine RDI  24.9 /hr;  non-supine RDI 3.3 /hr; REM RDI 6.1 /hr, supine REM RDI 4.5 /hr.  Respiratory events were associated with oxyhemoglobin desaturations (nadir during sleep 74%) from a mean of 92%). There were 0 occurrences of Cheyne Stokes breathing.   OXIMETRY: Total sleep time spent at, or below 88% was 14.5  minutes, or 4.9% of total sleep time. Snoring was classified as mild. BODY POSITION: Duration of total sleep and percent of total sleep in their respective position is as follows: supine 152 minutes (51%), non-supine 144 minutes (49%); right 48 minutes (16%), left 62 minutes (21%), and prone 33 minutes (11%). Total supine REM sleep time was 26 minutes (39% of total REM sleep).  LIMB MOVEMENTS: There were 0 periodic limb movements of sleep (0.0/hr), of which 0 (0.0/hr) were associated with an arousal.  EEG: With the limited montage recorded, no EEG abnormalities were observed. CARDIAC: The electrocardiogram documented EKG.  The average heart rate during sleep was 68 bpm.  The maximum heart rate during sleep was 85 bpm. The maximum heart rate during recording was 93.  BEHAVIORAL: No significant parasomnia behavior noted. Post study, the patient indicated, that sleep was worse than usual.   CODED DIAGNOSES:  Obstructive Sleep Apnea, Adult (G47.33-1) Dysfunctions associated with sleep stages or arousal from sleep  RECOMMENDATIONS: 1. This sleep study demonstrates overall mild obstructive sleep apnea (by AHI criteria), with a total AHI of 14.4/hour and O2 nadir of 74%. Given the patient's medical history and sleep related complaints, treatment with positive airway pressure is recommended; this can be achieved in the form of autoPAP. Alternatively, a full-night CPAP titration study would allow optimization of therapy if needed. Other treatment options may include avoidance of supine sleep position along with weight loss, upper airway or jaw surgery in selected patients or the use of an oral appliance in certain patients. ENT evaluation and/or consultation with a maxillofacial surgeon or dentist may be feasible in some instances.  2. Please note that untreated obstructive sleep apnea may carry additional perioperative morbidity. Patients with significant obstructive sleep apnea should receive perioperative  PAP therapy and the surgeons and particularly the anesthesiologist should be informed of the diagnosis and the severity of the sleep disordered breathing. 3. This study shows sleep fragmentation and abnormal sleep stage percentages; these are nonspecific findings and per se do not signify an intrinsic sleep disorder or a cause for the patient's sleep-related symptoms. Causes include (but are not limited to) the first night effect of the sleep study, circadian rhythm disturbances, medication effect or an underlying mood disorder or medical problem.  4. The patient should be cautioned not to drive, work at heights, or operate dangerous or heavy equipment when tired or sleepy. Review and reiteration of good sleep hygiene measures should be pursued with any patient. 5. The patient will be seen in follow-up by Dr. Frances Furbish at [facility] for discussion of the test results and further management strategies. The referring provider will be notified of the test results. I certify that I have reviewed the entire raw data recording prior to the issuance of this report in accordance with the Standards of Accreditation of the American Academy of Sleep Medicine (AASM) ? Huston Foley,  MD, PhD

## 2022-06-23 NOTE — Addendum Note (Signed)
Addended by: Huston Foley on: 06/23/2022 05:20 PM   Modules accepted: Orders

## 2022-06-29 NOTE — Telephone Encounter (Signed)
The recommendation is to start autopap so he will have a follow-up for that. We will call him and document in a separate phone encounter.

## 2022-06-29 NOTE — Telephone Encounter (Signed)
Patient left a voicemail on my phone asking if he needed to make a follow up about his sleep study that he had on 06/13/22.

## 2022-06-30 ENCOUNTER — Telehealth: Payer: Self-pay | Admitting: *Deleted

## 2022-06-30 NOTE — Telephone Encounter (Signed)
Spoke to patient gave sleep study results. Pt  chose [facility] for DME Informed patient of compliance. Wear CPAP 4+ hours nightly and come to all f/u appointments . Made patient initial f/u appointment 08/2022. Did give patient [facility]'s number. Pt expressed understanding and thanked me for calling. Will fax orders to DME this evening Per Dr Frances Furbish  will send sleep study results to referring MD

## 2022-06-30 NOTE — Telephone Encounter (Signed)
-----   Message from Huston Foley, MD sent at 06/23/2022  5:20 PM EDT ----- Patient referred by Dr. Drue Novel, seen by me on 05/05/22, diagnostic PSG on 06/13/22.    Please call and notify the patient that the recent sleep study showed mild obstructive sleep apnea, over all AHI was 14.4/hour, O2 nadir 74% with mild snoring noted. I recommend treatment in the form of autoPAP, which means, that we don't have to bring him back for a second sleep study with CPAP, but will let him try an autoPAP machine at home, through a DME company (of his choice, or as per insurance requirement). The DME representative will educate him on how to use the machine, how to put the mask on, etc. I have placed an order in the chart. Please send referral, talk to patient, send report to referring MD. We will need a FU in sleep clinic for 10 weeks post-PAP set up, please arrange that with me or one of our NPs. Thanks,   Huston Foley, MD, PhD [facility])

## 2022-08-03 ENCOUNTER — Encounter: Payer: Self-pay | Admitting: Internal Medicine

## 2022-08-03 ENCOUNTER — Ambulatory Visit (INDEPENDENT_AMBULATORY_CARE_PROVIDER_SITE_OTHER): Payer: 59 | Admitting: Internal Medicine

## 2022-08-03 VITALS — BP 138/80 | HR 98 | Temp 98.3°F | Resp 16 | Ht 71.0 in | Wt 192.0 lb

## 2022-08-03 DIAGNOSIS — E785 Hyperlipidemia, unspecified: Secondary | ICD-10-CM | POA: Diagnosis not present

## 2022-08-03 DIAGNOSIS — R972 Elevated prostate specific antigen [PSA]: Secondary | ICD-10-CM

## 2022-08-03 DIAGNOSIS — I10 Essential (primary) hypertension: Secondary | ICD-10-CM | POA: Diagnosis not present

## 2022-08-03 LAB — LIPID PANEL
Cholesterol: 179 mg/dL (ref 0–200)
HDL: 63.1 mg/dL (ref >39.00)
LDL Cholesterol: 98 mg/dL (ref 0–99)
NonHDL: 116.39
Total CHOL/HDL Ratio: 3
Triglycerides: 90 mg/dL (ref 0.0–149.0)
VLDL: 18 mg/dL (ref 0.0–40.0)

## 2022-08-03 LAB — PSA: PSA: 3.83 ng/mL (ref 0.10–4.00)

## 2022-08-03 MED ORDER — AMLODIPINE BESYLATE 10 MG PO TABS: 10.0000 mg | ORAL_TABLET | Freq: Every day | ORAL | 1 refills | Status: DC

## 2022-08-03 MED ORDER — ZOLPIDEM TARTRATE 10 MG PO TABS
10.0000 mg | ORAL_TABLET | Freq: Every evening | ORAL | 1 refills | Status: DC | PRN
Start: 2022-08-03 — End: 2024-02-12

## 2022-08-03 NOTE — Progress Notes (Unsigned)
   Subjective:    Patient ID: Keith West, male    DOB: 07-23-1971, 51 y.o.   MRN: [id number]  DOS:  08/03/2022 Type of visit - description: rov  Today we assessed all his chronic medical problems. Was recently diagnosed with OSA, having some issues with the mask. Saw dietitian, eating healthier, + weight loss. Elevated PSA velocity: Denies any LUTS, no dysuria-gross hematuria-difficulty urinating. Ambien: Works well, needs a refill. Having allergies, request advice.   Review of Systems See above   Past Medical History:  Diagnosis Date   Allergy    Anxiety 02/13/2014   ED (erectile dysfunction)    GERD (gastroesophageal reflux disease)    Heart murmur 1999   ECHO NI 2005-dr Tylesha Gibeault had done   Hyperlipidemia    Hypertension    Past hx    Kidney stones 2000    Past Surgical History:  Procedure Laterality Date   BUNIONECTOMY  1991   right   DENTAL SURGERY     SHOULDER ARTHROSCOPY WITH SUBACROMIAL DECOMPRESSION AND BICEP TENDON REPAIR Right 03/25/2013   Procedure: RIGHT SHOULDER ARTHROSCOPY WITH SUBACROMIAL DECOMPRESSION DISTAL CLAVICLE RESECTON LONG HEAD BICEP  TENDODESIS ;  Surgeon: Nita Sells, MD;  Location: [place];  Service: Orthopedics;  Laterality: Right;    Current Outpatient Medications  Medication Instructions   amLODipine (NORVASC) 10 mg, Oral, Daily   Multiple Vitamin (MULTIVITAMIN) tablet 1 tablet, Oral, Daily, One  A Day Mens    omeprazole (PRILOSEC OTC) 20 mg, Oral, Daily   zolpidem (AMBIEN) 10 mg, Oral, At bedtime PRN       Objective:   Physical Exam BP 138/80   Pulse 98   Temp 98.3 F (36.8 C) (Oral)   Resp 16   Ht 5\' 11"  (1.803 m)   Wt 192 lb (87.1 kg)   SpO2 97%   BMI 26.78 kg/m  General:   Well developed, NAD, BMI noted. HEENT:  Normocephalic . Face symmetric, atraumatic Lungs:  CTA B Normal respiratory effort, no intercostal retractions, no accessory muscle use. Heart: RRR,  no murmur.  Lower  extremities: no pretibial edema bilaterally  Skin: Not pale. Not jaundice Neurologic:  alert & oriented X3.  Speech normal, gait appropriate for age and unassisted Psych--  Cognition and judgment appear intact.  Cooperative with normal attention span and concentration.  Behavior appropriate. No anxious or depressed appearing.      Assessment    Assessment HTN Dyslipidemia Anxiety Insomnia: Ambien as needed  GERD Heart murmur, echo normal 2005 Erectile dysfunction Urolithiasis Mildly elevated LFTs : Hep B-C (-) 2018  OSA dx 2023, rx CPAP   PLAN: ROV HTN: Well-controlled, continue amlodipine,  RF sent Dyslipidemia: Intolerant to a couple of statins, saw dietitian, eating much healthier, + weight loss.  We agreed to check a FLP.  I will consider Zetia. Insomnia: Well-controlled, refill Ambien.  PDMP okay. OSA: Recently diagnosed, has some issues with the mask, advice provided, he is already aware of the benefits of CPAP use. Increased PSA velocity: Asymptomatic, DRE 6 months ago normal.  Check PSA. Seasonal allergies: Recommend Allegra and Flonase as needed. Vaccine advise provided. RTC 6 months CPX

## 2022-08-03 NOTE — Patient Instructions (Addendum)
Vaccines I recommend:  Flu shot COVID-vaccine  I am refilling your sleep medicine  For allergies: Allegra 60 mg: 1 tablet twice a day as needed.  This is a over-the-counter medication Flonase: 2 sprays on each side of the nose daily, as needed  Check the  blood pressure regularly BP GOAL is between 110/65 and  135/85. If it is consistently higher or lower, let me know     GO TO THE LAB : Get the blood work     [place], [place] back for   a physical exam by 01-2023

## 2022-08-04 NOTE — Assessment & Plan Note (Signed)
ROV HTN: Well-controlled, continue amlodipine,  RF sent Dyslipidemia: Intolerant to a couple of statins, saw dietitian, eating much healthier, + weight loss.  We agreed to check a FLP.  I will consider Zetia. Insomnia: Well-controlled, refill Ambien.  PDMP okay. OSA: Recently diagnosed, has some issues with the mask, advice provided, he is already aware of the benefits of CPAP use. Increased PSA velocity: Asymptomatic, DRE 6 months ago normal.  Check PSA. Seasonal allergies: Recommend Allegra and Flonase as needed. Vaccine advise provided. RTC 6 months CPX

## 2022-08-09 ENCOUNTER — Encounter: Payer: 59 | Admitting: Registered"

## 2022-08-09 MED ORDER — EZETIMIBE 10 MG PO TABS: 10.0000 mg | ORAL_TABLET | Freq: Every day | ORAL | 3 refills | Status: DC

## 2022-08-09 NOTE — Addendum Note (Signed)
Addended byDamita Dunnings D on: 08/09/2022 08:14 AM   Modules accepted: Orders

## 2022-08-10 ENCOUNTER — Encounter: Payer: 59 | Admitting: Registered"

## 2022-08-29 NOTE — Progress Notes (Signed)
[facility] [address] [place]. [place] [address] [phone]       OFFICE FOLLOW UP NOTE  Keith West Date of Birth:  06/01/1971 Medical Record Number:  [id number]   Reason for visit: Initial CPAP follow-up    SUBJECTIVE:   CHIEF COMPLAINT:  Chief Complaint  Patient presents with   Obstructive Sleep Apnea    Pt is well. He states the mask doesn't work well with his allergies to where he is breathing more throughout his mouth. Room 3 alone    HPI:   Update 08/30/2022 JM: Patient is being seen for initial CPAP compliance visit.  Completed sleep study 8/7 which showed overall mild OSA with total AHI of 14.4/h.  CPAP initiated on 8/29.  He has had some issues with his mask due to allergies, feels like he is breathing more out of his mouth. He is currently using Xyzal but plans on switching to Allegra soon as recommended by PCP.  He is currently using a full face mask. At times can have drooling.  He also notes the seal around his mask can be irritating to his face.  Has noticed some improvement of energy levels but some days still feels tired.  Epworth Sleepiness Scale 7/24 (prior to CPAP 12/24)         Consult visit 05/04/2022 Dr. Frances Furbish:  Keith West is a 51 year old right-handed gentleman with an underlying medical history of hypertension, hyperlipidemia, reflux disease, anxiety, allergies, kidney stones, and overweight state, who reports snoring and excessive daytime somnolence.  His wife has mentioned pauses in his breathing to him and sometimes elbows him or not just him at night.  She has sleep apnea and uses a CPAP or similar machine and has done well with it.  He is familiar with treating sleep apnea with a CPAP.  He has a nephew with sleep apnea who uses a CPAP as well.  Patient is working on smoking cessation.  He has reduced his cigarette smoking to 8 to 10 cigarettes/day.  He drinks alcohol occasionally, maybe twice a week.  He drinks  caffeine in the form of coffee, 1 or 2 servings per day and tea, 1 or 7 servings per day on average.  He does not drink soda daily, he has recently started going to the gym on a regular basis and when he goes to the gym he gets up at 4:30 AM.  For work, he has to be up at 5:45 AM.  He works 12-hour shifts from 7 AM to 7 PM, 3 days on, 1 day off, 3 days on and 3 days off and so forth.  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  He lives with his wife and 10 year old daughter, he also has a 67 year old son.  He has nocturia about once per average night, denies recurrent nocturnal or morning headaches but lately has had some slight headaches during the day. I reviewed your office note from 01/31/2022.  His Epworth sleepiness score is 12 out of 24, fatigue severity score is 23 out of 63.   Of note, his blood pressure is elevated today and he admits that he had not taken his amlodipine this morning.  Weight has been more or less the same, he had gained some weight around the time of his shoulder surgery in the past but then was able to lose it again.     ROS:   14 system review of systems performed and negative with exception  of those listed in HPI  PMH:  Past Medical History:  Diagnosis Date   Allergy    Anxiety 02/13/2014   ED (erectile dysfunction)    GERD (gastroesophageal reflux disease)    Heart murmur 1999   ECHO NI 2005-dr paz had done   Hyperlipidemia    Hypertension    Past hx    Kidney stones 2000    PSH:  Past Surgical History:  Procedure Laterality Date   BUNIONECTOMY  1991   right   DENTAL SURGERY     SHOULDER ARTHROSCOPY WITH SUBACROMIAL DECOMPRESSION AND BICEP TENDON REPAIR Right 03/25/2013   Procedure: RIGHT SHOULDER ARTHROSCOPY WITH SUBACROMIAL DECOMPRESSION DISTAL CLAVICLE RESECTON LONG HEAD BICEP  TENDODESIS ;  Surgeon: Nita Sells, MD;  Location: [facility];  Service: Orthopedics;  Laterality: Right;    Social History:   Social History   Socioeconomic History   Marital status: Married    Spouse name: Not on file   Number of children: 3   Years of education: Not on file   Highest education level: Not on file  Occupational History   Occupation: electrician  Tobacco Use   Smoking status: Every Day    Packs/day: 0.50    Types: Cigarettes   Smokeless tobacco: Never   Tobacco comments:    4-5 cigarrets qd   Substance and Sexual Activity   Alcohol use: Not Currently    Alcohol/week: 5.0 standard drinks of alcohol    Types: 5 Shots of liquor per week    Comment: beer on occasion   Drug use: No   Sexual activity: Not on file  Other Topics Concern   Not on file  Social History Narrative   Household-- pt, wife, 1 daughter     3 children, 2 living , 8 Gkids   Lost 57 y/o child (cerebral palsy) ~ 11-2016   Social Determinants of Health   Financial Resource Strain: Not on file  Food Insecurity: No Food Insecurity (04/14/2022)   Hunger Vital Sign    Worried About Running Out of Food in the Last Year: Never true    Ran Out of Food in the Last Year: Never true  Transportation Needs: Not on file  Physical Activity: Not on file  Stress: Not on file  Social Connections: Not on file  Intimate Partner Violence: Not on file    Family History:  Family History  Problem Relation Age of Onset   Hyperlipidemia Mother    Hypertension Mother        F and M   Colon polyps Mother    Hyperlipidemia Father    Alcohol abuse Father    Lung cancer Father    Breast cancer Sister    Stroke Paternal Aunt    Dementia Maternal Grandmother    Diabetes Other        M&F family   Sleep apnea Nephew    Coronary artery disease Neg Hx    Colon cancer Neg Hx    Prostate cancer Neg Hx    CAD Neg Hx    Esophageal cancer Neg Hx    Rectal cancer Neg Hx    Stomach cancer Neg Hx     Medications:   Current Outpatient Medications on File Prior to Visit  Medication Sig Dispense Refill   amLODipine (NORVASC) 10 MG tablet  Take 1 tablet (10 mg total) by mouth daily. 90 tablet 1   ezetimibe (ZETIA) 10 MG tablet Take 1 tablet (10 mg total) by  mouth daily. 90 tablet 3   Multiple Vitamin (MULTIVITAMIN) tablet Take 1 tablet by mouth daily. One  A Day Mens     omeprazole (PRILOSEC OTC) 20 MG tablet Take 20 mg by mouth daily.     zolpidem (AMBIEN) 10 MG tablet Take 1 tablet (10 mg total) by mouth at bedtime as needed for sleep. 90 tablet 1   No current facility-administered medications on file prior to visit.    Allergies:   Allergies  Allergen Reactions   Hydrocodone-Acetaminophen Nausea And Vomiting      OBJECTIVE:  Physical Exam  Vitals:   08/30/22 0800  BP: 138/89  Pulse: 100  Weight: 198 lb 8 oz (90 kg)  Height: 5\' 11"  (1.803 m)   Body mass index is 27.69 kg/m. No results found.   General: well developed, well nourished, pleasant middle-age African-American male, seated, in no evident distress Head: head normocephalic and atraumatic.   Neck: supple with no carotid or supraclavicular bruits Cardiovascular: regular rate and rhythm, no murmurs Musculoskeletal: no deformity Skin:  no rash/petichiae Vascular:  Normal pulses all extremities   Neurologic Exam Mental Status: Awake and fully alert. Oriented to place and time. Recent and remote memory intact. Attention span, concentration and fund of knowledge appropriate. Mood and affect appropriate.  Cranial Nerves: Pupils equal, briskly reactive to light. Extraocular movements full without nystagmus. Visual fields full to confrontation. Hearing intact. Facial sensation intact. Face, tongue, palate moves normally and symmetrically.  Motor: Normal bulk and tone. Normal strength in all tested extremity muscles Sensory.: intact to touch , pinprick , position and vibratory sensation.  Coordination: Rapid alternating movements normal in all extremities. Finger-to-nose and heel-to-shin performed accurately bilaterally. Gait and Station: Arises from chair  without difficulty. Stance is normal. Gait demonstrates normal stride length and balance without use of AD.  Reflexes: 1+ and symmetric. Toes downgoing.         ASSESSMENT/PLAN: Keith West is a 51 y.o. year old male     OSA on CPAP : Compliance report shows satisfactory usage with optimal residual AHI.  Discussed continued nightly usage with ensuring greater than 4 hours nightly for optimal benefit and per insurance purposes.  Discussed seasonal allergies, can also try fluticasone in addition to his current regimen for possible benefit.  Will place order to DME company to obtain new mask seal as current seal irritating to skin.  Continue to follow with DME company for any needed supplies or CPAP related concerns    Follow up in 5 months or call earlier if needed    CC:  PCP: 40, MD    I spent 23 minutes of face-to-face and non-face-to-face time with patient.  This included previsit chart review, lab review, study review, order entry, electronic health record documentation, patient education regarding diagnosis of sleep apnea with review and discussion of compliance report and answered all other questions to patient's satisfaction   Wanda Plump, [facility]  [facility] [address] [place], [place]  Phone [phone] Fax [phone] Note: This document was prepared with digital dictation and possible smart phrase technology. Any transcriptional errors that result from this process are unintentional.

## 2022-08-30 ENCOUNTER — Encounter: Payer: Self-pay | Admitting: Adult Health

## 2022-08-30 ENCOUNTER — Ambulatory Visit (INDEPENDENT_AMBULATORY_CARE_PROVIDER_SITE_OTHER): Payer: 59 | Admitting: Adult Health

## 2022-08-30 VITALS — BP 138/89 | HR 100 | Ht 71.0 in | Wt 198.5 lb

## 2022-08-30 DIAGNOSIS — J302 Other seasonal allergic rhinitis: Secondary | ICD-10-CM | POA: Diagnosis not present

## 2022-08-30 DIAGNOSIS — G4733 Obstructive sleep apnea (adult) (pediatric): Secondary | ICD-10-CM | POA: Diagnosis not present

## 2022-08-30 NOTE — Progress Notes (Signed)
CPAP Orders have been faxed to DME on file [facility] 08/30/22.

## 2022-12-05 DIAGNOSIS — G4733 Obstructive sleep apnea (adult) (pediatric): Secondary | ICD-10-CM | POA: Diagnosis not present

## 2023-01-05 DIAGNOSIS — G4733 Obstructive sleep apnea (adult) (pediatric): Secondary | ICD-10-CM | POA: Diagnosis not present

## 2023-01-09 ENCOUNTER — Other Ambulatory Visit: Payer: Self-pay | Admitting: Internal Medicine

## 2023-01-31 ENCOUNTER — Ambulatory Visit: Payer: Self-pay | Admitting: Adult Health

## 2023-02-03 DIAGNOSIS — G4733 Obstructive sleep apnea (adult) (pediatric): Secondary | ICD-10-CM | POA: Diagnosis not present

## 2023-02-06 ENCOUNTER — Ambulatory Visit: Payer: BC Managed Care – PPO | Admitting: Internal Medicine

## 2023-02-06 ENCOUNTER — Encounter: Payer: Self-pay | Admitting: Internal Medicine

## 2023-02-06 VITALS — BP 136/82 | HR 95 | Temp 98.3°F | Resp 16 | Ht 71.0 in | Wt 195.5 lb

## 2023-02-06 DIAGNOSIS — R399 Unspecified symptoms and signs involving the genitourinary system: Secondary | ICD-10-CM | POA: Diagnosis not present

## 2023-02-06 DIAGNOSIS — I10 Essential (primary) hypertension: Secondary | ICD-10-CM

## 2023-02-06 DIAGNOSIS — Z0000 Encounter for general adult medical examination without abnormal findings: Secondary | ICD-10-CM

## 2023-02-06 DIAGNOSIS — E785 Hyperlipidemia, unspecified: Secondary | ICD-10-CM | POA: Diagnosis not present

## 2023-02-06 LAB — CBC WITH DIFFERENTIAL/PLATELET
Basophils Absolute: 0 10*3/uL (ref 0.0–0.1)
Basophils Relative: 0.7 % (ref 0.0–3.0)
Eosinophils Absolute: 0.1 10*3/uL (ref 0.0–0.7)
Eosinophils Relative: 2.4 % (ref 0.0–5.0)
HCT: 46 % (ref 39.0–52.0)
Hemoglobin: 16.3 g/dL (ref 13.0–17.0)
Lymphocytes Relative: 23.2 % (ref 12.0–46.0)
Lymphs Abs: 1.3 10*3/uL (ref 0.7–4.0)
MCHC: 35.4 g/dL (ref 30.0–36.0)
MCV: 93.8 fl (ref 78.0–100.0)
Monocytes Absolute: 0.4 10*3/uL (ref 0.1–1.0)
Monocytes Relative: 6.3 % (ref 3.0–12.0)
Neutro Abs: 3.9 10*3/uL (ref 1.4–7.7)
Neutrophils Relative %: 67.4 % (ref 43.0–77.0)
Platelets: 360 10*3/uL (ref 150.0–400.0)
RBC: 4.9 Mil/uL (ref 4.22–5.81)
RDW: 13.2 % (ref 11.5–15.5)
WBC: 5.8 10*3/uL (ref 4.0–10.5)

## 2023-02-06 LAB — URINALYSIS, ROUTINE W REFLEX MICROSCOPIC
Bilirubin Urine: NEGATIVE
Ketones, ur: NEGATIVE
Leukocytes,Ua: NEGATIVE
Nitrite: NEGATIVE
Specific Gravity, Urine: 1.025 (ref 1.000–1.030)
Total Protein, Urine: NEGATIVE
Urine Glucose: NEGATIVE
Urobilinogen, UA: 0.2 (ref 0.0–1.0)
pH: 6.5 (ref 5.0–8.0)

## 2023-02-06 LAB — COMPREHENSIVE METABOLIC PANEL
ALT: 35 U/L (ref 0–53)
AST: 29 U/L (ref 0–37)
Albumin: 4.6 g/dL (ref 3.5–5.2)
Alkaline Phosphatase: 95 U/L (ref 39–117)
BUN: 7 mg/dL (ref 6–23)
CO2: 28 mEq/L (ref 19–32)
Calcium: 9.5 mg/dL (ref 8.4–10.5)
Chloride: 105 mEq/L (ref 96–112)
Creatinine, Ser: 0.87 mg/dL (ref 0.40–1.50)
GFR: 98.37 mL/min (ref >60.00)
Glucose, Bld: 93 mg/dL (ref 70–99)
Potassium: 4 mEq/L (ref 3.5–5.1)
Sodium: 139 mEq/L (ref 135–145)
Total Bilirubin: 0.5 mg/dL (ref 0.2–1.2)
Total Protein: 7.6 g/dL (ref 6.0–8.3)

## 2023-02-06 LAB — LIPID PANEL
Cholesterol: 164 mg/dL (ref 0–200)
HDL: 69.1 mg/dL (ref >39.00)
LDL Cholesterol: 82 mg/dL (ref 0–99)
NonHDL: 94.48
Total CHOL/HDL Ratio: 2
Triglycerides: 61 mg/dL (ref 0.0–149.0)
VLDL: 12.2 mg/dL (ref 0.0–40.0)

## 2023-02-06 LAB — PSA: PSA: 4.98 ng/mL — ABNORMAL HIGH (ref 0.10–4.00)

## 2023-02-06 NOTE — Progress Notes (Signed)
Subjective:    Patient ID: Keith West, male    DOB: 05/16/1971, 52 y.o.   MRN: [id number]  DOS:  02/06/2023 Type of visit - description: cpx  Since the last visit, he has been stressed. There has been some changes at his job. Has a daughter with bipolar d/o, that has been very difficult. Occasionally has urinary frequency without dysuria, gross hematuria, penile discharge or urgency.  Symptoms are random.  Review of Systems  Other than above, a 14 point review of systems is negative     Past Medical History:  Diagnosis Date   Allergy    Anxiety 02/13/2014   ED (erectile dysfunction)    GERD (gastroesophageal reflux disease)    Heart murmur 1999   ECHO NI 2005-dr Mi[place] Culbreth had done   Hyperlipidemia    Hypertension    Past hx    Kidney stones 2000    Past Surgical History:  Procedure Laterality Date   BUNIONECTOMY  1991   right   DENTAL SURGERY     SHOULDER ARTHROSCOPY WITH SUBACROMIAL DECOMPRESSION AND BICEP TENDON REPAIR Right 03/25/2013   Procedure: RIGHT SHOULDER ARTHROSCOPY WITH SUBACROMIAL DECOMPRESSION DISTAL CLAVICLE RESECTON LONG HEAD BICEP  TENDODESIS ;  Surgeon: Nita Sells, MD;  Location: [facility];  Service: Orthopedics;  Laterality: Right;   Social History   Socioeconomic History   Marital status: Married    Spouse name: Not on file   Number of children: 3   Years of education: Not on file   Highest education level: Not on file  Occupational History   Occupation: electrician  Tobacco Use   Smoking status: Former    Packs/day: .5    Types: Cigarettes, Pipe   Smokeless tobacco: Never   Tobacco comments:    Quit tobacco 09-2022  Substance and Sexual Activity   Alcohol use: Not Currently    Alcohol/week: 5.0 standard drinks of alcohol    Types: 5 Shots of liquor per week    Comment: beer on occasion   Drug use: No   Sexual activity: Not on file  Other Topics Concern   Not on file  Social History Narrative   Lost 61  y/o child (cerebral palsy) ~ 11-2016   Household-- pt, wife    daughter(bipolar)     3 children- lost one, 2 living , 8 Gkids   Social Determinants of Health   Financial Resource Strain: Not on file  Food Insecurity: No Food Insecurity (04/14/2022)   Hunger Vital Sign    Worried About Running Out of Food in the Last Year: Never true    Ran Out of Food in the Last Year: Never true  Transportation Needs: Not on file  Physical Activity: Not on file  Stress: Not on file  Social Connections: Not on file  Intimate Partner Violence: Not on file     Current Outpatient Medications  Medication Instructions   amLODipine (NORVASC) 10 mg, Oral, Daily   ezetimibe (ZETIA) 10 mg, Oral, Daily   Multiple Vitamin (MULTIVITAMIN) tablet 1 tablet, Oral, Daily, One  A Day Mens    omeprazole (PRILOSEC OTC) 20 mg, Oral, Daily   zolpidem (AMBIEN) 10 mg, Oral, At bedtime PRN       Objective:   Physical Exam BP 136/82   Pulse 95   Temp 98.3 F (36.8 C) (Oral)   Resp 16   Ht 5\' 11"  (1.803 m)   Wt 195 lb 8 oz (88.7 kg)   SpO2 97%  BMI 27.27 kg/m  General: Well developed, NAD, BMI noted Neck: No  thyromegaly  HEENT:  Normocephalic . Face symmetric, atraumatic Lungs:  CTA B Normal respiratory effort, no intercostal retractions, no accessory muscle use. Heart: RRR,  no murmur.  Abdomen:  Not distended, soft, non-tender. No rebound or rigidity.   Lower extremities: no pretibial edema bilaterally  DRE: Slightly increased size prostate, nontender, nonnodular Skin: Exposed areas without rash. Not pale. Not jaundice Neurologic:  alert & oriented X3.  Speech normal, gait appropriate for age and unassisted Strength symmetric and appropriate for age.  Psych: Cognition and judgment appear intact.  Cooperative with normal attention span and concentration.  Behavior appropriate. No anxious or depressed appearing.     Assessment     Assessment HTN Dyslipidemia: Intolerant to two  statins Anxiety Insomnia: Ambien as needed  GERD Heart murmur, echo normal 2005 Erectile dysfunction Urolithiasis Mildly elevated LFTs : Hep B-C (-) 2018  OSA dx 2023, rx CPAP   PLAN: Here for CPX HTN: Seems well-controlled on amlodipine. High cholesterol: Intolerant to two statins x 2, on Zetia, checking labs Insomnia, anxiety: Stress increased lately, has some changes at his job, daughter is bipolar.  Fortunately he is managing okay.  Takes Ambien as needed only OSA: Per neurology, encouraged consistent use of cpap. LUTS: Mild as described above, DRE shows slightly enlarged prostate.  Checking labs. RTC 6 months

## 2023-02-06 NOTE — Patient Instructions (Addendum)
Try to use your CPAP regularly, every night.  Vaccines I recommend: Covid booster Shingrix (shingles)  Check the  blood pressure regularly BP GOAL is between 110/65 and  135/85. If it is consistently higher or lower, let me know    GO TO THE LAB : Get the blood work     [place], [place] back for   a checkup in 6 months    "[place] of attorney" ,  "Living will" (Advance care planning documents)  If you already have a living will or healthcare power of attorney, is recommended you bring the copy to be scanned in your chart.   The document will be available to all the doctors you see in the system.  Advance care planning is a process that supports adults in  understanding and sharing their preferences regarding future medical care.  The patient's preferences are recorded in documents called Advance Directives and the can be modified at any time while the patient is in full mental capacity.   If you don't have one, please consider create one.      More information at: [email or web address]

## 2023-02-06 NOTE — Assessment & Plan Note (Addendum)
-  Td 1-18 - recommend:  shingrix-Covid vaccine - booster benefits d/w pt  - CCS: Had a colonoscopy 05-2019, next 10 years per Colonoscopy report -prostate cancer screening --> checking a PSA.  See comments under LUTS - labs: CMP FLP CBC UA PSA -Diet exercise: Discussed -Tobacco abuse: Quit last year,praised!

## 2023-02-06 NOTE — Assessment & Plan Note (Signed)
Here for CPX HTN: Seems well-controlled on amlodipine. High cholesterol: Intolerant to two statins x 2, on Zetia, checking labs Insomnia, anxiety: Stress increased lately, has some changes at his job, daughter is bipolar.  Fortunately he is managing okay.  Takes Ambien as needed only OSA: Per neurology, encouraged consistent use of cpap. LUTS: Mild as described above, DRE shows slightly enlarged prostate.  Checking labs. RTC 6 months

## 2023-02-08 ENCOUNTER — Telehealth: Payer: Self-pay | Admitting: Internal Medicine

## 2023-02-08 DIAGNOSIS — R972 Elevated prostate specific antigen [PSA]: Secondary | ICD-10-CM

## 2023-02-08 MED ORDER — SULFAMETHOXAZOLE-TRIMETHOPRIM 800-160 MG PO TABS: 1.0000 | ORAL_TABLET | Freq: Two times a day (BID) | ORAL | 0 refills | Status: DC

## 2023-02-08 NOTE — Telephone Encounter (Signed)
Had mild LUTS, prostate is slightly enlarged.  PSA increased. Mild prostatitis?. Please call patient, advise I suspect he may have mild prostatitis. Call Bactrim DS 1 p.o. twice daily for 2 weeks #28 no refills. Arrange for a recheck PSA, UA, urine culture in 3 months.  DX increased PSA.

## 2023-02-08 NOTE — Telephone Encounter (Signed)
Spoke w/ Pt- informed of results and recommendations. Rx sent and lab appt scheduled.

## 2023-02-23 DIAGNOSIS — G4733 Obstructive sleep apnea (adult) (pediatric): Secondary | ICD-10-CM | POA: Diagnosis not present

## 2023-03-06 DIAGNOSIS — G4733 Obstructive sleep apnea (adult) (pediatric): Secondary | ICD-10-CM | POA: Diagnosis not present

## 2023-03-27 ENCOUNTER — Encounter: Payer: Self-pay | Admitting: Neurology

## 2023-03-27 ENCOUNTER — Ambulatory Visit: Payer: Self-pay | Admitting: Neurology

## 2023-03-27 NOTE — Progress Notes (Deleted)
Patient: Keith West Date of Birth: 1970/11/21  Reason for Visit: Follow up History from: Patient Primary Neurologist:    ASSESSMENT AND PLAN 52 y.o. year old male    HISTORY OF PRESENT ILLNESS: Today 03/27/23   HISTORY Update 08/30/2022 JM: Patient is being seen for initial CPAP compliance visit.  Completed sleep study 8/7 which showed overall mild OSA with total AHI of 14.4/h.  CPAP initiated on 8/29.   He has had some issues with his mask due to allergies, feels like he is breathing more out of his mouth. He is currently using Xyzal but plans on switching to Allegra soon as recommended by PCP.  He is currently using a full face mask. At times can have drooling.  He also notes the seal around his mask can be irritating to his face.  Has noticed some improvement of energy levels but some days still feels tired.   Epworth Sleepiness Scale 7/24 (prior to CPAP 12/24)                Consult visit 05/04/2022 Dr. Frances West:  Mr. Keith West is a 52 year old right-handed gentleman with an underlying medical history of hypertension, hyperlipidemia, reflux disease, anxiety, allergies, kidney stones, and overweight state, who reports snoring and excessive daytime somnolence.  His wife has mentioned pauses in his breathing to him and sometimes elbows him or not just him at night.  She has sleep apnea and uses a CPAP or similar machine and has done well with it.  He is familiar with treating sleep apnea with a CPAP.  He has a nephew with sleep apnea who uses a CPAP as well.  Patient is working on smoking cessation.  He has reduced his cigarette smoking to 8 to 10 cigarettes/day.  He drinks alcohol occasionally, maybe twice a week.  He drinks caffeine in the form of coffee, 1 or 2 servings per day and tea, 1 or 7 servings per day on average.  He does not drink soda daily, he has recently started going to the gym on a regular basis and when he goes to the gym he gets up at 4:30 AM.  For work, he has  to be up at 5:45 AM.  He works 12-hour shifts from 7 AM to 7 PM, 3 days on, 1 day off, 3 days on and 3 days off and so forth.  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  He lives with his wife and 56 year old daughter, he also has a 18 year old son.  He has nocturia about once per average night, denies recurrent nocturnal or morning headaches but lately has had some slight headaches during the day. I reviewed your office note from 01/31/2022.  His Epworth sleepiness score is 12 out of 24, fatigue severity score is 23 out of 63.   Of note, his blood pressure is elevated today and he admits that he had not taken his amlodipine this morning.  Weight has been more or less the same, he had gained some weight around the time of his shoulder surgery in the past but then was able to lose it again.   REVIEW OF SYSTEMS: Out of a complete 14 system review of symptoms, the patient complains only of the following symptoms, and all other reviewed systems are negative.  See HPI  ALLERGIES: Allergies  Allergen Reactions   Hydrocodone-Acetaminophen Nausea And Vomiting    HOME MEDICATIONS: Outpatient Medications Prior to Visit  Medication Sig Dispense Refill   amLODipine (  NORVASC) 10 MG tablet TAKE 1 TABLET DAILY 90 tablet 1   ezetimibe (ZETIA) 10 MG tablet Take 1 tablet (10 mg total) by mouth daily. 90 tablet 3   Multiple Vitamin (MULTIVITAMIN) tablet Take 1 tablet by mouth daily. One  A Day Mens     omeprazole (PRILOSEC OTC) 20 MG tablet Take 20 mg by mouth daily.     sulfamethoxazole-trimethoprim (BACTRIM DS) 800-160 MG tablet Take 1 tablet by mouth 2 (two) times daily. 28 tablet 0   zolpidem (AMBIEN) 10 MG tablet Take 1 tablet (10 mg total) by mouth at bedtime as needed for sleep. 90 tablet 1   No facility-administered medications prior to visit.    PAST MEDICAL HISTORY: Past Medical History:  Diagnosis Date   Allergy    Anxiety 02/13/2014   ED (erectile dysfunction)    GERD  (gastroesophageal reflux disease)    Heart murmur 1999   ECHO NI 2005-dr paz had done   Hyperlipidemia    Hypertension    Past hx    Kidney stones 2000    PAST SURGICAL HISTORY: Past Surgical History:  Procedure Laterality Date   BUNIONECTOMY  1991   right   DENTAL SURGERY     SHOULDER ARTHROSCOPY WITH SUBACROMIAL DECOMPRESSION AND BICEP TENDON REPAIR Right 03/25/2013   Procedure: RIGHT SHOULDER ARTHROSCOPY WITH SUBACROMIAL DECOMPRESSION DISTAL CLAVICLE RESECTON LONG HEAD BICEP  TENDODESIS ;  Surgeon: Mable Paris, MD;  Location: [facility];  Service: Orthopedics;  Laterality: Right;    FAMILY HISTORY: Family History  Problem Relation Age of Onset   Hyperlipidemia Mother    Hypertension Mother        F and M   Colon polyps Mother    Hyperlipidemia Father    Alcohol abuse Father    Lung cancer Father    Breast cancer Sister    Dementia Maternal Grandmother    Stroke Paternal Aunt    Sleep apnea Nephew    Diabetes Other        M&F family   Bipolar disorder Daughter    Coronary artery disease Neg Hx    Colon cancer Neg Hx    Prostate cancer Neg Hx    CAD Neg Hx    Esophageal cancer Neg Hx    Rectal cancer Neg Hx    Stomach cancer Neg Hx     SOCIAL HISTORY: Social History   Socioeconomic History   Marital status: Married    Spouse name: Not on file   Number of children: 3   Years of education: Not on file   Highest education level: Not on file  Occupational History   Occupation: electrician  Tobacco Use   Smoking status: Former    Packs/day: .5    Types: Cigarettes, Pipe   Smokeless tobacco: Never   Tobacco comments:    Quit tobacco 09-2022  Substance and Sexual Activity   Alcohol use: Not Currently    Alcohol/week: 5.0 standard drinks of alcohol    Types: 5 Shots of liquor per week    Comment: beer on occasion   Drug use: No   Sexual activity: Not on file  Other Topics Concern   Not on file  Social History Narrative   Lost  11 y/o child (cerebral palsy) ~ 11-2016   Household-- pt, wife    daughter(bipolar)     3 children- lost one, 2 living , 8 Gkids   Social Determinants of Health   Financial Resource Strain: Not on  file  Food Insecurity: No Food Insecurity (04/14/2022)   Hunger Vital Sign    Worried About Running Out of Food in the Last Year: Never true    Ran Out of Food in the Last Year: Never true  Transportation Needs: Not on file  Physical Activity: Not on file  Stress: Not on file  Social Connections: Not on file  Intimate Partner Violence: Not on file    PHYSICAL EXAM  There were no vitals filed for this visit. There is no height or weight on file to calculate BMI.  Generalized: Well developed, in no acute distress  Neurological examination  Mentation: Alert oriented to time, place, history taking. Follows all commands speech and language fluent Cranial nerve II-XII: Pupils were equal round reactive to light. Extraocular movements were full, visual field were full on confrontational test. Facial sensation and strength were normal. Uvula tongue midline. Head turning and shoulder shrug  were normal and symmetric. Motor: The motor testing reveals 5 over 5 strength of all 4 extremities. Good symmetric motor tone is noted throughout.  Sensory: Sensory testing is intact to soft touch on all 4 extremities. No evidence of extinction is noted.  Coordination: Cerebellar testing reveals good finger-nose-finger and heel-to-shin bilaterally.  Gait and station: Gait is normal. Tandem gait is normal. Romberg is negative. No drift is seen.  Reflexes: Deep tendon reflexes are symmetric and normal bilaterally.   DIAGNOSTIC DATA (LABS, IMAGING, TESTING) - I reviewed patient records, labs, notes, testing and imaging myself where available.  Lab Results  Component Value Date   WBC 5.8 02/06/2023   HGB 16.3 02/06/2023   HCT 46.0 02/06/2023   MCV 93.8 02/06/2023   PLT 360.0 02/06/2023      Component Value  Date/Time   NA 139 02/06/2023 0858   K 4.0 02/06/2023 0858   CL 105 02/06/2023 0858   CO2 28 02/06/2023 0858   GLUCOSE 93 02/06/2023 0858   BUN 7 02/06/2023 0858   CREATININE 0.87 02/06/2023 0858   CALCIUM 9.5 02/06/2023 0858   PROT 7.6 02/06/2023 0858   ALBUMIN 4.6 02/06/2023 0858   AST 29 02/06/2023 0858   ALT 35 02/06/2023 0858   ALKPHOS 95 02/06/2023 0858   BILITOT 0.5 02/06/2023 0858   GFRNONAA >90 03/20/2013 1013   GFRAA >90 03/20/2013 1013   Lab Results  Component Value Date   CHOL 164 02/06/2023   HDL 69.10 02/06/2023   LDLCALC 82 02/06/2023   TRIG 61.0 02/06/2023   CHOLHDL 2 02/06/2023   No results found for: "HGBA1C" Lab Results  Component Value Date   VITAMINB12 481 11/24/2015   Lab Results  Component Value Date   TSH 1.03 01/31/2022    Margie Ege, AGNP-C, DNP 03/27/2023, 5:12 AM [facility] [address], [address] [place], [place] [address] [phone]

## 2023-04-05 DIAGNOSIS — G4733 Obstructive sleep apnea (adult) (pediatric): Secondary | ICD-10-CM | POA: Diagnosis not present

## 2023-05-10 ENCOUNTER — Other Ambulatory Visit (INDEPENDENT_AMBULATORY_CARE_PROVIDER_SITE_OTHER): Payer: BC Managed Care – PPO

## 2023-05-10 DIAGNOSIS — R972 Elevated prostate specific antigen [PSA]: Secondary | ICD-10-CM

## 2023-05-10 DIAGNOSIS — R399 Unspecified symptoms and signs involving the genitourinary system: Secondary | ICD-10-CM | POA: Diagnosis not present

## 2023-05-10 LAB — URINALYSIS, ROUTINE W REFLEX MICROSCOPIC
Bilirubin Urine: NEGATIVE
Ketones, ur: NEGATIVE
Nitrite: NEGATIVE
Specific Gravity, Urine: 1.025 (ref 1.000–1.030)
Total Protein, Urine: NEGATIVE
Urine Glucose: NEGATIVE
Urobilinogen, UA: 0.2 (ref 0.0–1.0)
pH: 6 (ref 5.0–8.0)

## 2023-05-10 LAB — PSA: PSA: 4.88 ng/mL — ABNORMAL HIGH (ref 0.10–4.00)

## 2023-05-11 LAB — URINE CULTURE
MICRO NUMBER:: 15158705
SPECIMEN QUALITY:: ADEQUATE

## 2023-05-12 NOTE — Addendum Note (Signed)
Addended byConrad [place] D on: 05/12/2023 01:07 PM   Modules accepted: Orders

## 2023-05-18 ENCOUNTER — Ambulatory Visit: Payer: BC Managed Care – PPO | Admitting: Urology

## 2023-05-18 ENCOUNTER — Encounter: Payer: Self-pay | Admitting: Urology

## 2023-05-18 VITALS — BP 169/89 | HR 101 | Ht 71.0 in | Wt 200.0 lb

## 2023-05-18 DIAGNOSIS — R972 Elevated prostate specific antigen [PSA]: Secondary | ICD-10-CM | POA: Diagnosis not present

## 2023-05-18 LAB — URINALYSIS, ROUTINE W REFLEX MICROSCOPIC
Bilirubin, UA: NEGATIVE
Glucose, UA: NEGATIVE
Ketones, UA: NEGATIVE
Leukocytes,UA: NEGATIVE
Nitrite, UA: NEGATIVE
Protein,UA: NEGATIVE
RBC, UA: NEGATIVE
Specific Gravity, UA: 1.015 (ref 1.005–1.030)
Urobilinogen, Ur: 0.2 mg/dL (ref 0.2–1.0)
pH, UA: 7 (ref 5.0–7.5)

## 2023-05-18 NOTE — Progress Notes (Signed)
Assessment: 1. Elevated PSA      Plan: Today I had a long discussion with the patient regarding his elevated PSA along with the issues and controversies regarding prostate cancer early detection.  We discussed options for further evaluation including prostate imaging as well as biopsy. Following our discussion, patient elects to proceed with transrectal ultrasound and prostate biopsy. Nature procedure reviewed in detail today including potential adverse events and complications.  Will schedule next available.  Chief Complaint: elevated psa  History of Present Illness:  Keith West is a 52 y.o. male who is seen in consultation from Wanda Plump, MD for evaluation of elevated PSA. Patient has had an elevated PSA with fluctuating values for a number of years.  See PSA data below. Patient reports no known family history of prostate cancer. Patient denies erectile dysfunction.  Patient does report some worsening lower urinary tract symptoms over the last few years.  Current IPSS = 9/3   PSA data: 08/2014  3.47 02/2015   2.04 12/2016   3.25 12/2017   2.81 12/2018   2.94 01/2020   3.02 01/2021   2.84 01/2022   3.43 07/2022   3.83 02/2023   4.98 05/2023   4.88  Past Medical History:  Past Medical History:  Diagnosis Date   Allergy    Anxiety 02/13/2014   ED (erectile dysfunction)    GERD (gastroesophageal reflux disease)    Heart murmur 1999   ECHO NI 2005-dr paz had done   Hyperlipidemia    Hypertension    Past hx    Kidney stones 2000    Past Surgical History:  Past Surgical History:  Procedure Laterality Date   BUNIONECTOMY  1991   right   DENTAL SURGERY     SHOULDER ARTHROSCOPY WITH SUBACROMIAL DECOMPRESSION AND BICEP TENDON REPAIR Right 03/25/2013   Procedure: RIGHT SHOULDER ARTHROSCOPY WITH SUBACROMIAL DECOMPRESSION DISTAL CLAVICLE RESECTON LONG HEAD BICEP  TENDODESIS ;  Surgeon: Mable Paris, MD;  Location: [facility];  Service:  Orthopedics;  Laterality: Right;    Allergies:  Allergies  Allergen Reactions   Hydrocodone-Acetaminophen Nausea And Vomiting    Family History:  Family History  Problem Relation Age of Onset   Hyperlipidemia Mother    Hypertension Mother        F and M   Colon polyps Mother    Hyperlipidemia Father    Alcohol abuse Father    Lung cancer Father    Breast cancer Sister    Dementia Maternal Grandmother    Stroke Paternal Aunt    Sleep apnea Nephew    Diabetes Other        M&F family   Bipolar disorder Daughter    Coronary artery disease Neg Hx    Colon cancer Neg Hx    Prostate cancer Neg Hx    CAD Neg Hx    Esophageal cancer Neg Hx    Rectal cancer Neg Hx    Stomach cancer Neg Hx     Social History:  Social History   Tobacco Use   Smoking status: Former    Current packs/day: 0.50    Types: Cigarettes, Pipe   Smokeless tobacco: Never   Tobacco comments:    Quit tobacco 09-2022  Substance Use Topics   Alcohol use: Not Currently    Alcohol/week: 5.0 standard drinks of alcohol    Types: 5 Shots of liquor per week    Comment: beer on occasion   Drug use:  No    Review of symptoms:  Constitutional:  Negative for unexplained weight loss, night sweats, fever, chills ENT:  Negative for nose bleeds, sinus pain, painful swallowing CV:  Negative for chest pain, shortness of breath, exercise intolerance, palpitations, loss of consciousness Resp:  Negative for cough, wheezing, shortness of breath GI:  Negative for nausea, vomiting, diarrhea, bloody stools GU:  Positives noted in HPI; otherwise negative for gross hematuria, dysuria, urinary incontinence Neuro:  Negative for seizures, poor balance, limb weakness, slurred speech Psych:  Negative for lack of energy, depression, anxiety Endocrine:  Negative for polydipsia, polyuria, symptoms of hypoglycemia (dizziness, hunger, sweating) Hematologic:  Negative for anemia, purpura, petechia, prolonged or excessive bleeding,  use of anticoagulants  Allergic:  Negative for difficulty breathing or choking as a result of exposure to anything; no shellfish allergy; no allergic response (rash/itch) to materials, foods  Physical exam: BP (!) 169/89   Pulse (!) 101   Ht 5\' 11"  (1.803 m)   Wt 200 lb (90.7 kg)   BMI 27.89 kg/m  GENERAL APPEARANCE:  Well appearing, well developed, well nourished, NAD  GU: Normal external genitalia DRE: Normal sphincter tone; prostate is large approximately 50 g without evidence of nodules or induration.

## 2023-05-23 ENCOUNTER — Ambulatory Visit: Payer: BC Managed Care – PPO | Admitting: Urology

## 2023-05-30 ENCOUNTER — Other Ambulatory Visit: Payer: BC Managed Care – PPO | Admitting: Urology

## 2023-05-30 ENCOUNTER — Ambulatory Visit (HOSPITAL_BASED_OUTPATIENT_CLINIC_OR_DEPARTMENT_OTHER): Payer: BC Managed Care – PPO

## 2023-07-11 ENCOUNTER — Other Ambulatory Visit (HOSPITAL_BASED_OUTPATIENT_CLINIC_OR_DEPARTMENT_OTHER): Payer: Self-pay | Admitting: Urology

## 2023-07-11 ENCOUNTER — Ambulatory Visit (HOSPITAL_BASED_OUTPATIENT_CLINIC_OR_DEPARTMENT_OTHER)
Admission: RE | Admit: 2023-07-11 | Discharge: 2023-07-11 | Disposition: A | Discharge status: Home / Self Care | Payer: BC Managed Care – PPO | Source: Ambulatory Visit | Attending: Urology | Admitting: Urology

## 2023-07-11 ENCOUNTER — Ambulatory Visit (INDEPENDENT_AMBULATORY_CARE_PROVIDER_SITE_OTHER): Payer: BC Managed Care – PPO | Admitting: Urology

## 2023-07-11 VITALS — BP 180/100 | HR 111 | Ht 71.0 in | Wt 200.0 lb

## 2023-07-11 DIAGNOSIS — Z2989 Encounter for other specified prophylactic measures: Secondary | ICD-10-CM | POA: Diagnosis not present

## 2023-07-11 DIAGNOSIS — R972 Elevated prostate specific antigen [PSA]: Secondary | ICD-10-CM | POA: Insufficient documentation

## 2023-07-11 DIAGNOSIS — C61 Malignant neoplasm of prostate: Secondary | ICD-10-CM

## 2023-07-11 DIAGNOSIS — N4232 Atypical small acinar proliferation of prostate: Secondary | ICD-10-CM

## 2023-07-11 DIAGNOSIS — N4231 Prostatic intraepithelial neoplasia: Secondary | ICD-10-CM

## 2023-07-11 LAB — URINALYSIS, ROUTINE W REFLEX MICROSCOPIC
Bilirubin, UA: NEGATIVE
Glucose, UA: NEGATIVE
Ketones, UA: NEGATIVE
Nitrite, UA: NEGATIVE
Specific Gravity, UA: 1.025 (ref 1.005–1.030)
Urobilinogen, Ur: 0.2 mg/dL (ref 0.2–1.0)
pH, UA: 6.5 (ref 5.0–7.5)

## 2023-07-11 LAB — MICROSCOPIC EXAMINATION

## 2023-07-11 MED ORDER — CEFTRIAXONE SODIUM 1 G IJ SOLR
1.0000 g | Freq: Once | INTRAMUSCULAR | Status: AC
  Administered 2023-07-11: 1 g via INTRAMUSCULAR

## 2023-07-11 NOTE — Progress Notes (Signed)
IM Injection  Patient is present today for an IM Injection for treatment of infection prevention post prostate biopsy Drug: Ceftriaxone Dose:1g Location:RIGHT GLUTE Lot: [id number] Exp:05/2025 Patient tolerated well, no complications were noted  Performed by: Verner Kopischke N., CMA(AAMA)

## 2023-07-11 NOTE — Progress Notes (Signed)
Assessment: 1. Elevated PSA     Plan: Per post biopsy instructions Follow-up 1 week to review path and further recommendations.  Chief Complaint: Elevated PSA  HPI: Keith West is a 52 y.o. male who presents for continued evaluation of elevated psa. Patient is here today for planned transrectal ultrasound and prostate biopsy.  He has completed standard prep and received Rocephin 1 g IM 30 minutes prior to the procedure.    Portions of the above documentation were copied from a prior visit for review purposes only.  Allergies: Allergies  Allergen Reactions   Hydrocodone-Acetaminophen Nausea And Vomiting    PMH: Past Medical History:  Diagnosis Date   Allergy    Anxiety 02/13/2014   ED (erectile dysfunction)    GERD (gastroesophageal reflux disease)    Heart murmur 1999   ECHO NI 2005-dr paz had done   Hyperlipidemia    Hypertension    Past hx    Kidney stones 2000    PSH: Past Surgical History:  Procedure Laterality Date   BUNIONECTOMY  1991   right   DENTAL SURGERY     SHOULDER ARTHROSCOPY WITH SUBACROMIAL DECOMPRESSION AND BICEP TENDON REPAIR Right 03/25/2013   Procedure: RIGHT SHOULDER ARTHROSCOPY WITH SUBACROMIAL DECOMPRESSION DISTAL CLAVICLE RESECTON LONG HEAD BICEP  TENDODESIS ;  Surgeon: Mable Paris, MD;  Location: [facility];  Service: Orthopedics;  Laterality: Right;    SH: Social History   Tobacco Use   Smoking status: Former    Current packs/day: 0.50    Types: Cigarettes, Pipe   Smokeless tobacco: Never   Tobacco comments:    Quit tobacco 09-2022  Substance Use Topics   Alcohol use: Not Currently    Alcohol/week: 5.0 standard drinks of alcohol    Types: 5 Shots of liquor per week    Comment: beer on occasion   Drug use: No    ROS: Constitutional:  Negative for fever, chills, weight loss CV: Negative for chest pain, previous MI, hypertension Respiratory:  Negative for shortness of breath, wheezing,  sleep apnea, frequent cough GI:  Negative for nausea, vomiting, bloody stool, GERD  PE: BP (!) 180/100   Pulse (!) 111   Ht 5\' 11"  (1.803 m)   Wt 200 lb (90.7 kg)   BMI 27.89 kg/m  GENERAL APPEARANCE:  Well appearing, well developed, well nourished, NAD    Results: UA is negative for infection   TRANSRECTAL ULTRASOUND AND PROSTATE BIOPSY  Indication:  Elevated PSA  Prophylactic antibiotic administration: Rocephin  All medications that could result in increased bleeding were discontinued within an appropriate period of the time of biopsy.  Risk including bleeding and infection were discussed.  Informed consent was obtained.  The patient was placed in the left lateral decubitus position.  DRE:  30gm; benign to palpation  PROCEDURE 1.  TRANSRECTAL ULTRASOUND OF THE PROSTATE  The 7 MHz transrectal probe was used to image the prostate.  Anal stenosis was not noted.  TRUS volume: 32 ml  Hypoechoic areas: central zone at base   Hyperechoic areas: none  Central calcifications: not present  Margins:  well defined  Seminal Vesicles: normal   PROCEDURE 2:  PROSTATE BIOPSY  A periprostatic block was performed using 1% lidocaine and transrectal ultrasound guidance. Under transrectal ultrasound guidance, and using the Biopty gun, prostate biopsies were obtained systematically from the apex, mid gland, and base bilaterally.  A total of 12 cores were obtained.  Hemostasis was obtained with gentle pressure on the  prostate.  The procedures were well-tolerated.  No significant bleeding was noted at the end of the procedure.  The patient was stable for discharge from the office.

## 2023-07-12 ENCOUNTER — Other Ambulatory Visit: Payer: Self-pay | Admitting: Internal Medicine

## 2023-07-13 ENCOUNTER — Encounter: Payer: Self-pay | Admitting: Urology

## 2023-07-18 ENCOUNTER — Ambulatory Visit: Payer: BC Managed Care – PPO | Admitting: Urology

## 2023-07-18 ENCOUNTER — Encounter: Payer: Self-pay | Admitting: Urology

## 2023-07-18 VITALS — BP 150/89 | HR 96 | Ht 71.0 in | Wt 200.0 lb

## 2023-07-18 DIAGNOSIS — C61 Malignant neoplasm of prostate: Secondary | ICD-10-CM | POA: Diagnosis not present

## 2023-07-18 NOTE — Progress Notes (Addendum)
   Assessment: 1. Malignant neoplasm of prostate [facility])     Plan: Today had a long and extensive discussion with the patient regarding his recently diagnosed clinically localized low risk prostate cancer.  I discussed the natural history of his disease as well as options for further evaluation and treatment.  At this time I have recommended genomic testing to assess risk.  Will obtain decipher testing. Follow-up after above for further counseling regarding risk and treatment options.   ADDENDUM: DECIPHER GENOMIC RISK GROUP= LOW (0.32  Chief Complaint: Biopsy results  HPI: Keith West is a 52 y.o. male who presents for evaluation of prostate cancer found on recent biopsy. Patient denies any complications as a result of his recent biopsy.  Prostate cancer summary: Initial diagnosis TRUS/BX 07/2023 Path: 3/12 cores positive; grade group 1 (Gleason score 3+3 = 6) max core involvement = 44%. TRUS volume = 32 mL PSA 05/2023= 4.88  Portions of the above documentation were copied from a prior visit for review purposes only.  Allergies: Allergies  Allergen Reactions   Hydrocodone-Acetaminophen Nausea And Vomiting    PMH: Past Medical History:  Diagnosis Date   Allergy    Anxiety 02/13/2014   ED (erectile dysfunction)    GERD (gastroesophageal reflux disease)    Heart murmur 1999   ECHO NI 2005-dr paz had done   Hyperlipidemia    Hypertension    Past hx    Kidney stones 2000    PSH: Past Surgical History:  Procedure Laterality Date   BUNIONECTOMY  1991   right   DENTAL SURGERY     SHOULDER ARTHROSCOPY WITH SUBACROMIAL DECOMPRESSION AND BICEP TENDON REPAIR Right 03/25/2013   Procedure: RIGHT SHOULDER ARTHROSCOPY WITH SUBACROMIAL DECOMPRESSION DISTAL CLAVICLE RESECTON LONG HEAD BICEP  TENDODESIS ;  Surgeon: Mable Paris, MD;  Location: [facility];  Service: Orthopedics;  Laterality: Right;    SH: Social History   Tobacco Use   Smoking  status: Former    Current packs/day: 0.50    Types: Cigarettes, Pipe   Smokeless tobacco: Never   Tobacco comments:    Quit tobacco 09-2022  Substance Use Topics   Alcohol use: Not Currently    Alcohol/week: 5.0 standard drinks of alcohol    Types: 5 Shots of liquor per week    Comment: beer on occasion   Drug use: No    ROS: Constitutional:  Negative for fever, chills, weight loss CV: Negative for chest pain, previous MI, hypertension Respiratory:  Negative for shortness of breath, wheezing, sleep apnea, frequent cough GI:  Negative for nausea, vomiting, bloody stool, GERD  PE: BP (!) 150/89   Pulse 96   Ht 5\' 11"  (1.803 m)   Wt 200 lb (90.7 kg)   BMI 27.89 kg/m  GENERAL APPEARANCE:  Well appearing, well developed, well nourished, NAD

## 2023-08-01 DIAGNOSIS — C61 Malignant neoplasm of prostate: Secondary | ICD-10-CM | POA: Diagnosis not present

## 2023-08-02 ENCOUNTER — Encounter: Payer: Self-pay | Admitting: Urology

## 2023-08-07 ENCOUNTER — Other Ambulatory Visit (HOSPITAL_BASED_OUTPATIENT_CLINIC_OR_DEPARTMENT_OTHER): Payer: BC Managed Care – PPO

## 2023-08-08 ENCOUNTER — Ambulatory Visit: Payer: BC Managed Care – PPO | Admitting: Internal Medicine

## 2023-08-08 ENCOUNTER — Encounter: Payer: Self-pay | Admitting: Urology

## 2023-08-08 ENCOUNTER — Ambulatory Visit: Payer: BC Managed Care – PPO | Admitting: Urology

## 2023-08-08 VITALS — BP 137/90 | HR 85

## 2023-08-08 DIAGNOSIS — C61 Malignant neoplasm of prostate: Secondary | ICD-10-CM | POA: Diagnosis not present

## 2023-08-08 NOTE — Progress Notes (Signed)
   Assessment: 1. Malignant neoplasm of prostate [facility])     Plan: Today I again had a long discussion with the patient regarding his low risk prostate cancer including its natural history and standard treatment options.  We discussed options of active surveillance as well as definitive treatment with surgery and radiation therapy. Following our discussion, patient is most interested in active surveillance. Given this, I have recommended a confirmatory multiparametric prostate MRI (with targeted biopsy for any PI-RADS 4 or 5 lesions) to establish appropriateness for active surveillance. Patient will follow-up after MRI which we will order to be done in approximately 8 weeks.  Chief Complaint: Prostate cancer  HPI: Keith West is a 52 y.o. male who presents for continued evaluation of low risk prostate cancer. Patient currently reports doing well clinically.  No specific GU complaints. See my note 05/18/2023 to time of initial visit for detailed history and exam.  Patient reports no known family history of prostate cancer. Patient denies erectile dysfunction.   Patient does report some worsening lower urinary tract symptoms over the last few years.  Current IPSS = 9/3   Prostate cancer summary: Initial diagnosis TRUS/BX 07/2023 Path: 3/12 cores positive; grade group 1 (Gleason score 3+3 = 6) max core involvement = 44%. TRUS volume = 32 mL PSA 05/2023= 4.88 Clinical stage T1c/nl DRE  DECIPHER GENOMIC RISK GROUP= LOW (0.32)  Portions of the above documentation were copied from a prior visit for review purposes only.  Allergies: Allergies  Allergen Reactions   Hydrocodone-Acetaminophen Nausea And Vomiting    PMH: Past Medical History:  Diagnosis Date   Allergy    Anxiety 02/13/2014   ED (erectile dysfunction)    GERD (gastroesophageal reflux disease)    Heart murmur 1999   ECHO NI 2005-dr paz had done   Hyperlipidemia    Hypertension    Past hx    Kidney stones 2000     PSH: Past Surgical History:  Procedure Laterality Date   BUNIONECTOMY  1991   right   DENTAL SURGERY     SHOULDER ARTHROSCOPY WITH SUBACROMIAL DECOMPRESSION AND BICEP TENDON REPAIR Right 03/25/2013   Procedure: RIGHT SHOULDER ARTHROSCOPY WITH SUBACROMIAL DECOMPRESSION DISTAL CLAVICLE RESECTON LONG HEAD BICEP  TENDODESIS ;  Surgeon: Mable Paris, MD;  Location: [facility];  Service: Orthopedics;  Laterality: Right;    SH: Social History   Tobacco Use   Smoking status: Former    Current packs/day: 0.50    Types: Cigarettes, Pipe   Smokeless tobacco: Never   Tobacco comments:    Quit tobacco 09-2022  Substance Use Topics   Alcohol use: Not Currently    Alcohol/week: 5.0 standard drinks of alcohol    Types: 5 Shots of liquor per week    Comment: beer on occasion   Drug use: No    ROS: Constitutional:  Negative for fever, chills, weight loss CV: Negative for chest pain, previous MI, hypertension Respiratory:  Negative for shortness of breath, wheezing, sleep apnea, frequent cough GI:  Negative for nausea, vomiting, bloody stool, GERD  PE: BP (!) 137/90   Pulse 85  GENERAL APPEARANCE:  Well appearing, well developed, well nourished, NAD    Results: UA clear

## 2023-08-09 LAB — MICROSCOPIC EXAMINATION

## 2023-08-09 LAB — URINALYSIS, ROUTINE W REFLEX MICROSCOPIC
Bilirubin, UA: NEGATIVE
Glucose, UA: NEGATIVE
Ketones, UA: NEGATIVE
Leukocytes,UA: NEGATIVE
Nitrite, UA: NEGATIVE
Specific Gravity, UA: 1.015 (ref 1.005–1.030)
Urobilinogen, Ur: 0.2 mg/dL (ref 0.2–1.0)
pH, UA: 6 (ref 5.0–7.5)

## 2023-08-21 ENCOUNTER — Encounter: Payer: Self-pay | Admitting: Internal Medicine

## 2023-08-21 ENCOUNTER — Ambulatory Visit: Payer: BC Managed Care – PPO | Admitting: Internal Medicine

## 2023-08-21 VITALS — BP 138/72 | HR 100 | Temp 98.1°F | Resp 16 | Ht 71.0 in | Wt 192.5 lb

## 2023-08-21 DIAGNOSIS — M545 Low back pain, unspecified: Secondary | ICD-10-CM | POA: Diagnosis not present

## 2023-08-21 DIAGNOSIS — C61 Malignant neoplasm of prostate: Secondary | ICD-10-CM

## 2023-08-21 DIAGNOSIS — I10 Essential (primary) hypertension: Secondary | ICD-10-CM

## 2023-08-21 DIAGNOSIS — G4700 Insomnia, unspecified: Secondary | ICD-10-CM

## 2023-08-21 NOTE — Patient Instructions (Addendum)
Vaccines I recommend: Covid booster Shingrix (shingles) Flu shot  Call if the pain is no better in the next 2-3 weeks    Continue checking your  blood pressure regularly Blood pressure goal:  between 110/65 and  135/85. If it is consistently higher or lower, let me know      Next visit with me  by April 2025 for a physical     Please schedule it at the front desk

## 2023-08-21 NOTE — Progress Notes (Unsigned)
Subjective:    Patient ID: Keith West, male    DOB: 04-Oct-1971, 52 y.o.   MRN: [id number]  DOS:  08/21/2023 Type of visit - description: Routine checkup  Since last visit he was diagnosed with prostate cancer, urology notes reviewed. Patient is doing well emotionally. Ambulatory BPs were slightly elevated but they are coming down.  Patient check from time to time.  On August 2024 he helped his daughter move and did a lot of lifting, since then is having some right-sided low back pain, no radiation, no paresthesias. Is only in the last few days that he started to take a OTC medication and is feeling somewhat better.  BP Readings from Last 3 Encounters:  08/21/23 138/72  08/08/23 (!) 137/90  07/18/23 (!) 150/89     Review of Systems See above   Past Medical History:  Diagnosis Date   Allergy    Anxiety 02/13/2014   ED (erectile dysfunction)    GERD (gastroesophageal reflux disease)    Heart murmur 1999   ECHO NI 2005-dr Porter Moes had done   Hyperlipidemia    Hypertension    Past hx    Kidney stones 2000    Past Surgical History:  Procedure Laterality Date   BUNIONECTOMY  1991   right   DENTAL SURGERY     SHOULDER ARTHROSCOPY WITH SUBACROMIAL DECOMPRESSION AND BICEP TENDON REPAIR Right 03/25/2013   Procedure: RIGHT SHOULDER ARTHROSCOPY WITH SUBACROMIAL DECOMPRESSION DISTAL CLAVICLE RESECTON LONG HEAD BICEP  TENDODESIS ;  Surgeon: Mable Paris, MD;  Location: [facility];  Service: Orthopedics;  Laterality: Right;    Current Outpatient Medications  Medication Instructions   amLODipine (NORVASC) 10 mg, Oral, Daily   ezetimibe (ZETIA) 10 mg, Oral, Daily   Multiple Vitamin (MULTIVITAMIN) tablet 1 tablet, Oral, Daily, One  A Day Mens    omeprazole (PRILOSEC OTC) 20 mg, Oral, Daily   sulfamethoxazole-trimethoprim (BACTRIM DS) 800-160 MG tablet 1 tablet, Oral, 2 times daily   zolpidem (AMBIEN) 10 mg, Oral, At bedtime PRN       Objective:    Physical Exam BP 138/72   Pulse 100   Temp 98.1 F (36.7 C) (Oral)   Resp 16   Ht 5\' 11"  (1.803 m)   Wt 192 lb 8 oz (87.3 kg)   SpO2 97%   BMI 26.85 kg/m  General:   Well developed, NAD, BMI noted. HEENT:  Normocephalic . Face symmetric, atraumatic Lungs:  CTA B Normal respiratory effort, no intercostal retractions, no accessory muscle use. Heart: RRR,  no murmur.  MSK: No TTP at the lumbar spine or SI joints.  Hips with normal rotation. Skin: Not pale. Not jaundice Neurologic:  alert & oriented X3.  Speech normal, gait appropriate for age and unassisted Psych--  Cognition and judgment appear intact.  Cooperative with normal attention span and concentration.  Behavior appropriate. No anxious or depressed appearing.      Assessment    Assessment HTN Dyslipidemia: Intolerant to two statins Anxiety Insomnia: Ambien as needed  GERD Heart murmur, echo normal 2005 GU: ---Prostate cancer Dx 07-2023 ---ED ---Urolithiasis OSA dx 2023, rx CPAP Mildly elevated LFTs : Hep B-C (-) 2018     PLAN: Prostate cancer:Since LOV, he was diagnosed with prostate cancer, low risk, options discussed, patient elected active surveillance.  Urology is planning MRI for a targeted biopsy. Patient is doing well physically and emotionally. HTN:On amlodipine, BPs were slightly elevated in the last few weeks but they are back  to normal.  No change. Insomnia: On Ambien as needed only Low back pain: As described above, no red flags, is getting better in the last several days.  Asked patient to call in the next couple of weeks if the improvement does not continue, will recommend a x-ray and perhaps further medication. Vaccines: Declined any vaccines. RTC 6 months CPX     Here for CPX HTN: Seems well-controlled on amlodipine. High cholesterol: Intolerant to two statins x 2, on Zetia, checking labs Insomnia, anxiety: Stress increased lately, has some changes at his job, daughter is bipolar.   Fortunately he is managing okay.  Takes Ambien as needed only OSA: Per neurology, encouraged consistent use of cpap. LUTS: Mild as described above, DRE shows slightly enlarged prostate.  Checking labs. RTC 6 months

## 2023-08-22 DIAGNOSIS — C61 Malignant neoplasm of prostate: Secondary | ICD-10-CM | POA: Insufficient documentation

## 2023-08-22 NOTE — Assessment & Plan Note (Signed)
Prostate cancer:Since LOV, was dx w/ prostate cancer, low risk, options discussed, patient elected active surveillance.  Urology is planning MRI for a targeted biopsy. Patient is doing well physically and emotionally. HTN:On amlodipine, BPs were slightly elevated in the last few weeks but they are back to normal.  No change. Insomnia: On Ambien as needed only Low back pain: As described above, no red flags, is getting better in the last several days.  Asked patient to call in the next couple of weeks if the improvement does not continue, will recommend a x-ray and perhaps further evaluation Vaccines: Declined any vaccines. RTC 6 months CPX

## 2023-09-25 ENCOUNTER — Ambulatory Visit
Admission: RE | Admit: 2023-09-25 | Discharge: 2023-09-25 | Disposition: A | Discharge status: Home / Self Care | Payer: BC Managed Care – PPO | Source: Ambulatory Visit | Attending: Urology | Admitting: Urology

## 2023-09-25 DIAGNOSIS — C61 Malignant neoplasm of prostate: Secondary | ICD-10-CM | POA: Diagnosis not present

## 2023-09-25 DIAGNOSIS — N400 Enlarged prostate without lower urinary tract symptoms: Secondary | ICD-10-CM | POA: Diagnosis not present

## 2023-09-25 MED ORDER — GADOPICLENOL 0.5 MMOL/ML IV SOLN
9.0000 mL | Freq: Once | INTRAVENOUS | Status: AC | PRN
  Administered 2023-09-25: 9 mL via INTRAVENOUS

## 2023-10-19 ENCOUNTER — Ambulatory Visit: Payer: BC Managed Care – PPO | Admitting: Urology

## 2023-10-19 ENCOUNTER — Encounter: Payer: Self-pay | Admitting: Urology

## 2023-10-19 VITALS — BP 139/83 | HR 87

## 2023-10-19 DIAGNOSIS — C61 Malignant neoplasm of prostate: Secondary | ICD-10-CM | POA: Diagnosis not present

## 2023-10-19 NOTE — Progress Notes (Signed)
   Assessment: 1. Malignant neoplasm of prostate [facility])     Plan: Patient elects to continue active surveillance for his low risk prostate cancer.  I again discussed the logistics and nature of active surveillance follow-up. Psa today FU 61mo with psa prior to visit  Chief Complaint: Prostate cancer  HPI: Keith West is a 52 y.o. male who presents for continued evaluation of prostate cancer.  Patient reports currently doing well-no GU complaints  See my note 05/18/2023 to time of initial visit for detailed history and exam.   Patient reports no known family history of prostate cancer. Patient denies erectile dysfunction.   Patient does report some worsening lower urinary tract symptoms over the last few years.  Current IPSS = 9/3     Prostate cancer summary: Initial diagnosis TRUS/BX 07/2023 Path: 3/12 cores positive; grade group 1 (Gleason score 3+3 = 6) max core involvement = 44%. TRUS volume = 32 mL; MRI volume = 47 mL PSA 05/2023= 4.88 Clinical stage T1c/nl DRE   DECIPHER GENOMIC RISK GROUP= LOW (0.32)  Multiparametric prostate MRI 09/2023--no focal lesions    Portions of the above documentation were copied from a prior visit for review purposes only.  Allergies: Allergies  Allergen Reactions   Hydrocodone-Acetaminophen Nausea And Vomiting    PMH: Past Medical History:  Diagnosis Date   Allergy    Anxiety 02/13/2014   ED (erectile dysfunction)    GERD (gastroesophageal reflux disease)    Heart murmur 1999   ECHO NI 2005-dr paz had done   Hyperlipidemia    Hypertension    Past hx    Kidney stones 2000    PSH: Past Surgical History:  Procedure Laterality Date   BUNIONECTOMY  1991   right   DENTAL SURGERY     SHOULDER ARTHROSCOPY WITH SUBACROMIAL DECOMPRESSION AND BICEP TENDON REPAIR Right 03/25/2013   Procedure: RIGHT SHOULDER ARTHROSCOPY WITH SUBACROMIAL DECOMPRESSION DISTAL CLAVICLE RESECTON LONG HEAD BICEP  TENDODESIS ;  Surgeon: Mable Paris, MD;  Location: [facility];  Service: Orthopedics;  Laterality: Right;    SH: Social History   Tobacco Use   Smoking status: Former    Current packs/day: 0.50    Types: Cigarettes, Pipe   Smokeless tobacco: Never   Tobacco comments:    Quit tobacco 09-2022  Substance Use Topics   Alcohol use: Not Currently    Alcohol/week: 5.0 standard drinks of alcohol    Types: 5 Shots of liquor per week    Comment: beer on occasion   Drug use: No    ROS: Constitutional:  Negative for fever, chills, weight loss CV: Negative for chest pain, previous MI, hypertension Respiratory:  Negative for shortness of breath, wheezing, sleep apnea, frequent cough GI:  Negative for nausea, vomiting, bloody stool, GERD  PE: Today's Vitals   10/19/23 1340  BP: 139/83  Pulse: 87   There is no height or weight on file to calculate BMI.;s GENERAL APPEARANCE:  Well appearing, well developed, well nourished, NAD    Results: Ua neg

## 2023-10-20 LAB — URINALYSIS, ROUTINE W REFLEX MICROSCOPIC
Bilirubin, UA: NEGATIVE
Glucose, UA: NEGATIVE
Ketones, UA: NEGATIVE
Leukocytes,UA: NEGATIVE
Nitrite, UA: NEGATIVE
Specific Gravity, UA: 1.025 (ref 1.005–1.030)
Urobilinogen, Ur: 0.2 mg/dL (ref 0.2–1.0)
pH, UA: 6 (ref 5.0–7.5)

## 2023-10-20 LAB — MICROSCOPIC EXAMINATION: RBC, Urine: NONE SEEN /[HPF] (ref 0–2)

## 2023-10-20 LAB — PSA: Prostate Specific Ag, Serum: 6.3 ng/mL — ABNORMAL HIGH (ref 0.0–4.0)

## 2024-01-15 ENCOUNTER — Encounter: Payer: Self-pay | Admitting: Urology

## 2024-01-15 ENCOUNTER — Ambulatory Visit: Payer: BC Managed Care – PPO | Admitting: Urology

## 2024-01-15 VITALS — BP 154/91 | HR 88 | Ht 71.0 in | Wt 200.0 lb

## 2024-01-15 DIAGNOSIS — C61 Malignant neoplasm of prostate: Secondary | ICD-10-CM

## 2024-01-15 LAB — URINALYSIS, ROUTINE W REFLEX MICROSCOPIC
Bilirubin, UA: NEGATIVE
Glucose, UA: NEGATIVE
Ketones, UA: NEGATIVE
Leukocytes,UA: NEGATIVE
Nitrite, UA: NEGATIVE
Protein,UA: NEGATIVE
RBC, UA: NEGATIVE
Specific Gravity, UA: <1.005 — ABNORMAL LOW (ref 1.005–1.030)
Urobilinogen, Ur: 0.2 mg/dL (ref 0.2–1.0)
pH, UA: 5.5 (ref 5.0–7.5)

## 2024-01-15 NOTE — Progress Notes (Signed)
 Assessment: 1. Malignant neoplasm of prostate (HCC); low risk; GG 1; dx'd 9/24; on active surveillance     Plan: PSA today Return to office in 3 months  I discussed the role of active surveillance for management of low risk and favorable intermediate risk prostate cancer. The advantages of active surveillance were discussed including the following:  - Somewhere between 50% and 68% of eligible patients may safely avoid treatment for at least 10 years - Patient's will avoid possible side effects of definitive therapy that may be unnecessary - Quality of life/normal activities will be less affected while on active surveillance - Risk of unnecessary treatment of small, indolent cancers will be reduced  Limitations of active surveillance discussed including: -Between 32% and 50% of patients will require treatment by 10 years, although treatment delays do not seem to impact cure rate -Although the risk is very low (<0.5% according to most published series), it is possible for cancers progressed to a regional or metastatic stage while on active surveillance  I also discussed that patients who choose active surveillance should have regular follow-up including PSA testing approximately every 6 months, DRE annually, confirmatory biopsy approximately 1 year after initial diagnosis, multiparametric MRI every 12 months or as clinically indicated.  Chief Complaint: Prostate cancer  HPI: Keith West is a 53 y.o. male who presents for continued evaluation of low risk prostate cancer.   Patient reports no known family history of prostate cancer. Patient denies erectile dysfunction.   Patient does report some worsening lower urinary tract symptoms over the last few years.      Prostate cancer summary: Initial diagnosis TRUS/BX 07/2023 Path: 3/12 cores positive; grade group 1 (Gleason score 3+3 = 6) max core involvement = 44%. TRUS volume = 32 mL; MRI volume = 47 mL PSA 05/2023= 4.88 Clinical  stage T1c/nl DRE   DECIPHER GENOMIC RISK GROUP= LOW (0.32)  Multiparametric prostate MRI 09/2023--no focal lesions PSA 12/24:  6.3  He returns today for follow-up.  No new lower urinary tract symptoms.  He continues with nocturia x 1.  No dysuria or gross hematuria. IPSS = 3/1 today.  Portions of the above documentation were copied from a prior visit for review purposes only.  Allergies: Allergies  Allergen Reactions   Hydrocodone-Acetaminophen Nausea And Vomiting    PMH: Past Medical History:  Diagnosis Date   Allergy    Anxiety 02/13/2014   ED (erectile dysfunction)    GERD (gastroesophageal reflux disease)    Heart murmur 1999   ECHO NI 2005-dr paz had done   Hyperlipidemia    Hypertension    Past hx    Kidney stones 2000    PSH: Past Surgical History:  Procedure Laterality Date   BUNIONECTOMY  1991   right   DENTAL SURGERY     SHOULDER ARTHROSCOPY WITH SUBACROMIAL DECOMPRESSION AND BICEP TENDON REPAIR Right 03/25/2013   Procedure: RIGHT SHOULDER ARTHROSCOPY WITH SUBACROMIAL DECOMPRESSION DISTAL CLAVICLE RESECTON LONG HEAD BICEP  TENDODESIS ;  Surgeon: Mable Paris, MD;  Location: [facility];  Service: Orthopedics;  Laterality: Right;    SH: Social History   Tobacco Use   Smoking status: Former    Current packs/day: 0.50    Types: Cigarettes, Pipe   Smokeless tobacco: Never   Tobacco comments:    Quit tobacco 09-2022  Substance Use Topics   Alcohol use: Not Currently    Alcohol/week: 5.0 standard drinks of alcohol    Types: 5 Shots of liquor per  week    Comment: beer on occasion   Drug use: No    ROS: Constitutional:  Negative for fever, chills, weight loss CV: Negative for chest pain, previous MI, hypertension Respiratory:  Negative for shortness of breath, wheezing, sleep apnea, frequent cough GI:  Negative for nausea, vomiting, bloody stool, GERD  PE: BP (!) 154/91   Pulse 88   Ht 5\' 11"  (1.803 m)   Wt 200 lb (90.7  kg)   BMI 27.89 kg/m  GENERAL APPEARANCE:  Well appearing, well developed, well nourished, NAD HEENT:  Atraumatic, normocephalic, oropharynx clear NECK:  Supple without lymphadenopathy or thyromegaly ABDOMEN:  Soft, non-tender, no masses EXTREMITIES:  Moves all extremities well, without clubbing, cyanosis, or edema NEUROLOGIC:  Alert and oriented x 3, normal gait, CN II-XII grossly intact MENTAL STATUS:  appropriate BACK:  Non-tender to palpation, No CVAT SKIN:  Warm, dry, and intact GU: Prostate: 40 g, NT, no nodules Rectum: Normal tone,  no masses or tenderness   Results: U/A: Negative

## 2024-01-16 LAB — PSA: Prostate Specific Ag, Serum: 7 ng/mL — ABNORMAL HIGH (ref 0.0–4.0)

## 2024-01-18 ENCOUNTER — Encounter: Payer: Self-pay | Admitting: Urology

## 2024-02-12 ENCOUNTER — Encounter: Payer: Self-pay | Admitting: Internal Medicine

## 2024-02-12 ENCOUNTER — Ambulatory Visit (INDEPENDENT_AMBULATORY_CARE_PROVIDER_SITE_OTHER): Payer: BC Managed Care – PPO | Admitting: Internal Medicine

## 2024-02-12 VITALS — BP 132/80 | HR 85 | Temp 98.4°F | Resp 16 | Ht 71.0 in | Wt 195.5 lb

## 2024-02-12 DIAGNOSIS — Z0001 Encounter for general adult medical examination with abnormal findings: Secondary | ICD-10-CM | POA: Diagnosis not present

## 2024-02-12 DIAGNOSIS — Z0000 Encounter for general adult medical examination without abnormal findings: Secondary | ICD-10-CM | POA: Diagnosis not present

## 2024-02-12 DIAGNOSIS — G4733 Obstructive sleep apnea (adult) (pediatric): Secondary | ICD-10-CM

## 2024-02-12 DIAGNOSIS — G4700 Insomnia, unspecified: Secondary | ICD-10-CM

## 2024-02-12 DIAGNOSIS — I10 Essential (primary) hypertension: Secondary | ICD-10-CM

## 2024-02-12 LAB — CBC WITH DIFFERENTIAL/PLATELET
Basophils Absolute: 0.1 10*3/uL (ref 0.0–0.1)
Basophils Relative: 2.4 % (ref 0.0–3.0)
Eosinophils Absolute: 0.2 10*3/uL (ref 0.0–0.7)
Eosinophils Relative: 3.4 % (ref 0.0–5.0)
HCT: 44.9 % (ref 39.0–52.0)
Hemoglobin: 15.6 g/dL (ref 13.0–17.0)
Lymphocytes Relative: 29.8 % (ref 12.0–46.0)
Lymphs Abs: 1.5 10*3/uL (ref 0.7–4.0)
MCHC: 34.8 g/dL (ref 30.0–36.0)
MCV: 95.2 fl (ref 78.0–100.0)
Monocytes Absolute: 0.4 10*3/uL (ref 0.1–1.0)
Monocytes Relative: 7.5 % (ref 3.0–12.0)
Neutro Abs: 2.8 10*3/uL (ref 1.4–7.7)
Neutrophils Relative %: 56.9 % (ref 43.0–77.0)
Platelets: 366 10*3/uL (ref 150.0–400.0)
RBC: 4.71 Mil/uL (ref 4.22–5.81)
RDW: 13 % (ref 11.5–15.5)
WBC: 5 10*3/uL (ref 4.0–10.5)

## 2024-02-12 LAB — COMPREHENSIVE METABOLIC PANEL WITH GFR
ALT: 43 U/L (ref 0–53)
AST: 40 U/L — ABNORMAL HIGH (ref 0–37)
Albumin: 4.5 g/dL (ref 3.5–5.2)
Alkaline Phosphatase: 99 U/L (ref 39–117)
BUN: 9 mg/dL (ref 6–23)
CO2: 24 meq/L (ref 19–32)
Calcium: 9.1 mg/dL (ref 8.4–10.5)
Chloride: 106 meq/L (ref 96–112)
Creatinine, Ser: 0.81 mg/dL (ref 0.40–1.50)
GFR: 99.8 mL/min (ref >60.00)
Glucose, Bld: 100 mg/dL — ABNORMAL HIGH (ref 70–99)
Potassium: 3.6 meq/L (ref 3.5–5.1)
Sodium: 138 meq/L (ref 135–145)
Total Bilirubin: 0.5 mg/dL (ref 0.2–1.2)
Total Protein: 7.4 g/dL (ref 6.0–8.3)

## 2024-02-12 LAB — LIPID PANEL
Cholesterol: 157 mg/dL (ref 0–200)
HDL: 61.8 mg/dL (ref >39.00)
LDL Cholesterol: 77 mg/dL (ref 0–99)
NonHDL: 95.64
Total CHOL/HDL Ratio: 3
Triglycerides: 93 mg/dL (ref 0.0–149.0)
VLDL: 18.6 mg/dL (ref 0.0–40.0)

## 2024-02-12 MED ORDER — ZOLPIDEM TARTRATE 10 MG PO TABS
10.0000 mg | ORAL_TABLET | Freq: Every evening | ORAL | 1 refills | Status: AC | PRN
Start: 2024-02-12 — End: ?

## 2024-02-12 NOTE — Assessment & Plan Note (Signed)
 Here for CPX - Td 1-18 - recommend: Flu shot every fall, Shingrix, COVID booster.  Benefits discussed - CCS: Had a colonoscopy 05-2019, next 10 years per Colonoscopy report -Dx with prostate cancer, 07-2023, sees urology - labs: CMP FLP CBC -Diet exercise: Discussed -Tobacco abuse:   Quit tobacco 09-2022. Smoking again, 2-3 cigarette every day . Smoke 1/2 ppd from age 53, now 34: 17 pack a year

## 2024-02-12 NOTE — Patient Instructions (Addendum)
 Vaccines I recommend: Flu shot every fall COVID booster Shingrix (shingles)   Check the  blood pressure regularly Blood pressure goal:  between 110/65 and  135/85. If it is consistently higher or lower, let me know     GO TO THE LAB : Get the blood work     Please go to the front desk: Arrange for a follow-up in 1 year.  Sooner if needed.

## 2024-02-12 NOTE — Assessment & Plan Note (Signed)
 Here for CPX   Other issues addressed. HTN: On amlodipine, BP looks good, no change.  Recommend to check from time to time Dyslipidemia, intolerant to to statins, on Zetia, check FLP. Insomnia: Uses Ambien very seldom, request a refill, PDMP okay, Rx sent OSA: Intolerant to CPAP, OSA risks discussed, encouraged to try again, refer to neurology. RTC 1 year

## 2024-02-12 NOTE — Progress Notes (Signed)
 Subjective:    Patient ID: Keith West, male    DOB: 11/27/1970, 53 y.o.   MRN: [id number]  DOS:  02/12/2024 Type of visit - description: CPX  Here for CPX. Doing well. Essentially no symptoms. Not using his CPAP  Review of Systems     A 14 point review of systems is negative    Past Medical History:  Diagnosis Date   Allergy    Anxiety 02/13/2014   ED (erectile dysfunction)    GERD (gastroesophageal reflux disease)    Heart murmur 1999   ECHO NI 2005-dr Jahmil Macleod had done   Hyperlipidemia    Hypertension    Past hx    Kidney stones 2000    Past Surgical History:  Procedure Laterality Date   BUNIONECTOMY  1991   right   DENTAL SURGERY     SHOULDER ARTHROSCOPY WITH SUBACROMIAL DECOMPRESSION AND BICEP TENDON REPAIR Right 03/25/2013   Procedure: RIGHT SHOULDER ARTHROSCOPY WITH SUBACROMIAL DECOMPRESSION DISTAL CLAVICLE RESECTON LONG HEAD BICEP  TENDODESIS ;  Surgeon: Mable Paris, MD;  [facility];  Service: Orthopedics;  Laterality: Right;   Social History   Socioeconomic History   Marital status: Married    Spouse name: Not on file   Number of children: 3   Years of education: Not on file   Highest education level: Not on file  Occupational History   Occupation: electrician  Tobacco Use   Smoking status: Every Day    Current packs/day: 0.50    Types: Cigarettes, Pipe   Smokeless tobacco: Never   Tobacco comments:    Quit tobacco 09-2022. Smoking again, 2-3 cigarette every day .    Smoke 1/2 ppd from age 27, now 65: 17 pack a year   Substance and Sexual Activity   Alcohol use: Not Currently    Alcohol/week: 5.0 standard drinks of alcohol    Types: 5 Shots of liquor per week    Comment: beer on occasion   Drug use: No   Sexual activity: Not on file  Other Topics Concern   Not on file  Social History Narrative   Lost 85 y/o child (cerebral palsy) ~ 11-2016   Household-- pt, wife   daughter(bipolar)     3 children- lost  one, 2 living , 8 Gkids   Social Drivers of Corporate investment banker Strain: Not on file  Food Insecurity: No Food Insecurity (04/14/2022)   Hunger Vital Sign    Worried About Running Out of Food in the Last Year: Never true    Ran Out of Food in the Last Year: Never true  Transportation Needs: Not on file  Physical Activity: Not on file  Stress: Not on file  Social Connections: Not on file  Intimate Partner Violence: Not on file     Current Outpatient Medications  Medication Instructions   amLODipine (NORVASC) 10 mg, Oral, Daily   ezetimibe (ZETIA) 10 mg, Oral, Daily   Multiple Vitamin (MULTIVITAMIN) tablet 1 tablet, Daily   omeprazole (PRILOSEC OTC) 20 mg, Daily   zolpidem (AMBIEN) 10 mg, Oral, At bedtime PRN       Objective:   Physical Exam BP 132/80   Pulse 85   Temp 98.4 F (36.9 C) (Oral)   Resp 16   Ht 5\' 11"  (1.803 m)   Wt 195 lb 8 oz (88.7 kg)   SpO2 98%   BMI 27.27 kg/m  General: Well developed, NAD, BMI noted Neck: No  thyromegaly  HEENT:  Normocephalic . Face symmetric, atraumatic Lungs:  CTA B Normal respiratory effort, no intercostal retractions, no accessory muscle use. Heart: RRR,  no murmur.  Abdomen:  Not distended, soft, non-tender. No rebound or rigidity.   Lower extremities: no pretibial edema bilaterally  Skin: Exposed areas without rash. Not pale. Not jaundice Neurologic:  alert & oriented X3.  Speech normal, gait appropriate for age and unassisted Strength symmetric and appropriate for age.  Psych: Cognition and judgment appear intact.  Cooperative with normal attention span and concentration.  Behavior appropriate. No anxious or depressed appearing.     Assessment    Assessment HTN Dyslipidemia: Intolerant to statins x 2 Anxiety Insomnia: Ambien as needed  GERD Heart murmur, echo normal 2005 GU: ---Prostate cancer Dx 07-2023 ---ED ---Urolithiasis OSA dx 2023, rx CPAP Mildly elevated LFTs : Hep B-C (-) 2018     PLAN: Here for CPX - Td 1-18 - recommend: Flu shot every fall, Shingrix, COVID booster.  Benefits discussed - CCS: Had a colonoscopy 05-2019, next 10 years per Colonoscopy report -Dx with prostate cancer, 07-2023, sees urology - labs: CMP FLP CBC -Diet exercise: Discussed -Tobacco abuse:   Quit tobacco 09-2022. Smoking again, 2-3 cigarette every day . Smoke 1/2 ppd from age 5, now 33: 17 pack a year  Other issues addressed. HTN: On amlodipine, BP looks good, no change.  Recommend to check from time to time Dyslipidemia, intolerant to to statins, on Zetia, check FLP. Insomnia: Uses Ambien very seldom, request a refill, PDMP okay, Rx sent OSA: Intolerant to CPAP, OSA risks discussed, encouraged to try again, refer to neurology. RTC 1 year

## 2024-02-14 ENCOUNTER — Encounter: Payer: Self-pay | Admitting: Internal Medicine

## 2024-04-15 ENCOUNTER — Encounter: Payer: Self-pay | Admitting: Urology

## 2024-04-15 ENCOUNTER — Telehealth: Payer: Self-pay | Admitting: Internal Medicine

## 2024-04-15 ENCOUNTER — Ambulatory Visit (INDEPENDENT_AMBULATORY_CARE_PROVIDER_SITE_OTHER): Admitting: Urology

## 2024-04-15 VITALS — BP 143/89 | HR 98 | Ht 71.0 in | Wt 200.0 lb

## 2024-04-15 DIAGNOSIS — C61 Malignant neoplasm of prostate: Secondary | ICD-10-CM | POA: Diagnosis not present

## 2024-04-15 LAB — URINALYSIS, ROUTINE W REFLEX MICROSCOPIC
Bilirubin, UA: NEGATIVE
Glucose, UA: NEGATIVE
Ketones, UA: NEGATIVE
Leukocytes,UA: NEGATIVE
Nitrite, UA: NEGATIVE
Protein,UA: NEGATIVE
RBC, UA: NEGATIVE
Specific Gravity, UA: 1.01 (ref 1.005–1.030)
Urobilinogen, Ur: 0.2 mg/dL (ref 0.2–1.0)
pH, UA: 5.5 (ref 5.0–7.5)

## 2024-04-15 MED ORDER — AMLODIPINE BESYLATE 10 MG PO TABS: 10.0000 mg | ORAL_TABLET | Freq: Every day | ORAL | 1 refills | Status: DC

## 2024-04-15 MED ORDER — EZETIMIBE 10 MG PO TABS: 10.0000 mg | ORAL_TABLET | Freq: Every day | ORAL | 1 refills | Status: DC

## 2024-04-15 NOTE — Progress Notes (Signed)
 Assessment: 1. Malignant neoplasm of prostate (HCC); low risk; GG 1; dx'd 9/24; on active surveillance     Plan: PSA today Return to office in 3 months Will arrange for confirmatory biopsy after his next visit  I discussed the role of active surveillance for management of low risk and favorable intermediate risk prostate cancer. The advantages of active surveillance were discussed including the following:  - Somewhere between 50% and 68% of eligible patients may safely avoid treatment for at least 10 years - Patient's will avoid possible side effects of definitive therapy that may be unnecessary - Quality of life/normal activities will be less affected while on active surveillance - Risk of unnecessary treatment of small, indolent cancers will be reduced  Limitations of active surveillance discussed including: -Between 32% and 50% of patients will require treatment by 10 years, although treatment delays do not seem to impact cure rate -Although the risk is very low (<0.5% according to most published series), it is possible for cancers progressed to a regional or metastatic stage while on active surveillance  I also discussed that patients who choose active surveillance should have regular follow-up including PSA testing approximately every 6 months, DRE annually, confirmatory biopsy approximately 1 year after initial diagnosis, multiparametric MRI every 12 months or as clinically indicated.  Chief Complaint: Prostate cancer  HPI: Keith West is a 53 y.o. male who presents for continued evaluation of low risk prostate cancer.   Patient reports no known family history of prostate cancer. Patient denies erectile dysfunction.   Patient does report some worsening lower urinary tract symptoms over the last few years.      Prostate cancer summary: Initial diagnosis TRUS/BX 07/2023 Path: 3/12 cores positive; grade group 1 (Gleason score 3+3 = 6) max core involvement = 44%. TRUS  volume = 32 mL; MRI volume = 47 mL PSA 05/2023= 4.88 Clinical stage T1c/nl DRE   DECIPHER GENOMIC RISK GROUP= LOW (0.32)  Multiparametric prostate MRI 09/2023--no focal lesions PSA results: 12/24:  6.3 3/25: 7.0  He was seen in March 2025.  No new lower urinary tract symptoms.  He continued with nocturia x 1.  No dysuria or gross hematuria. IPSS = 3/1.  He returns today for follow-up.  His lower urinary tract symptoms remain stable.  He has some frequency and nocturia x 1.  No dysuria or gross hematuria.  His weight is stable.  No bone pain. IPSS = 5/1.  Portions of the above documentation were copied from a prior visit for review purposes only.  Allergies: Allergies  Allergen Reactions   Hydrocodone-Acetaminophen  Nausea And Vomiting    PMH: Past Medical History:  Diagnosis Date   Allergy    Anxiety 02/13/2014   ED (erectile dysfunction)    GERD (gastroesophageal reflux disease)    Heart murmur 1999   ECHO NI 2005-dr paz had done   Hyperlipidemia    Hypertension    Past hx    Kidney stones 2000    PSH: Past Surgical History:  Procedure Laterality Date   BUNIONECTOMY  1991   right   DENTAL SURGERY     SHOULDER ARTHROSCOPY WITH SUBACROMIAL DECOMPRESSION AND BICEP TENDON REPAIR Right 03/25/2013   Procedure: RIGHT SHOULDER ARTHROSCOPY WITH SUBACROMIAL DECOMPRESSION DISTAL CLAVICLE RESECTON LONG HEAD BICEP  TENDODESIS ;  Surgeon: Derald Flattery, MD;  Location: [facility];  Service: Orthopedics;  Laterality: Right;    SH: Social History   Tobacco Use   Smoking status: Every Day  Current packs/day: 0.50    Types: Cigarettes, Pipe   Smokeless tobacco: Never   Tobacco comments:    Quit tobacco 09-2022. Smoking again, 2-3 cigarette every day .    Smoke 1/2 ppd from age 25, now 81: 17 pack a year   Substance Use Topics   Alcohol use: Not Currently    Alcohol/week: 5.0 standard drinks of alcohol    Types: 5 Shots of liquor per week     Comment: beer on occasion   Drug use: No    ROS: Constitutional:  Negative for fever, chills, weight loss CV: Negative for chest pain, previous MI, hypertension Respiratory:  Negative for shortness of breath, wheezing, sleep apnea, frequent cough GI:  Negative for nausea, vomiting, bloody stool, GERD  PE: BP (!) 143/89   Pulse 98   Ht 5\' 11"  (1.803 m)   Wt 200 lb (90.7 kg)   BMI 27.89 kg/m  GENERAL APPEARANCE:  Well appearing, well developed, well nourished, NAD HEENT:  Atraumatic, normocephalic, oropharynx clear NECK:  Supple  LUNGS:  CTA bilaterally CV:  RRR ABDOMEN:  Soft, non-tender, no masses EXTREMITIES:  Moves all extremities well, without clubbing, cyanosis, or edema NEUROLOGIC:  Alert and oriented x 3, normal gait, CN II-XII grossly intact MENTAL STATUS:  appropriate BACK:  Non-tender to palpation, No CVAT SKIN:  Warm, dry, and intact   Results: U/A: Negative

## 2024-04-15 NOTE — Telephone Encounter (Signed)
 Rxs sent

## 2024-04-15 NOTE — Addendum Note (Signed)
 Addended by: Anyely Cunning D on: 04/15/2024 10:33 AM   Modules accepted: Orders

## 2024-04-15 NOTE — Telephone Encounter (Signed)
 Medication:amLODipine  (NORVASC ) 10 MG tablet  ezetimibe  (ZETIA ) 10 MG tablet  Has the patient contacted their pharmacy? No.   Preferred Pharmacy:  [id number] [place], [place] - [address], [place] [address] Phone: [phone]  Fax: [phone]

## 2024-04-16 ENCOUNTER — Ambulatory Visit: Payer: Self-pay | Admitting: Urology

## 2024-04-16 LAB — PSA: Prostate Specific Ag, Serum: 6.3 ng/mL — ABNORMAL HIGH (ref 0.0–4.0)

## 2024-04-18 ENCOUNTER — Ambulatory Visit: Payer: BC Managed Care – PPO | Admitting: Urology

## 2024-07-16 ENCOUNTER — Encounter: Payer: Self-pay | Admitting: Urology

## 2024-07-16 ENCOUNTER — Ambulatory Visit (INDEPENDENT_AMBULATORY_CARE_PROVIDER_SITE_OTHER): Admitting: Urology

## 2024-07-16 VITALS — BP 159/92 | HR 93 | Ht 71.0 in | Wt 200.0 lb

## 2024-07-16 DIAGNOSIS — C61 Malignant neoplasm of prostate: Secondary | ICD-10-CM

## 2024-07-16 DIAGNOSIS — N529 Male erectile dysfunction, unspecified: Secondary | ICD-10-CM

## 2024-07-16 LAB — MICROSCOPIC EXAMINATION: Bacteria, UA: NONE SEEN

## 2024-07-16 LAB — URINALYSIS, ROUTINE W REFLEX MICROSCOPIC
Bilirubin, UA: NEGATIVE
Glucose, UA: NEGATIVE
Ketones, UA: NEGATIVE
Leukocytes,UA: NEGATIVE
Nitrite, UA: NEGATIVE
Protein,UA: NEGATIVE
Specific Gravity, UA: 1.01 (ref 1.005–1.030)
Urobilinogen, Ur: 0.2 mg/dL (ref 0.2–1.0)
pH, UA: 7 (ref 5.0–7.5)

## 2024-07-16 MED ORDER — TADALAFIL 20 MG PO TABS: 20.0000 mg | ORAL_TABLET | Freq: Every day | ORAL | 11 refills | Status: DC | PRN

## 2024-07-16 MED ORDER — TADALAFIL 20 MG PO TABS: 20.0000 mg | ORAL_TABLET | Freq: Every day | ORAL | 11 refills | Status: AC | PRN

## 2024-07-16 NOTE — Progress Notes (Signed)
 Assessment: 1. Malignant neoplasm of prostate (HCC); low risk; GG 1; dx'd 9/24; on active surveillance   2. Organic impotence     Plan: PSA today Schedule for confirmatory prostate biopsy Prescription for tadalafil  20 mg as needed provided.  Chief Complaint: Prostate cancer  HPI: Keith West is a 53 y.o. male who presents for continued evaluation of low risk prostate cancer.       Prostate cancer summary: Initial diagnosis TRUS/BX 07/2023 Path: 3/12 cores positive; grade group 1 (Gleason score 3+3 = 6) max core involvement = 44%. TRUS volume = 32 mL; MRI volume = 47 mL PSA 05/2023= 4.88 Clinical stage T1c/nl DRE   DECIPHER GENOMIC RISK GROUP= LOW (0.32)  Multiparametric prostate MRI 09/2023--no focal lesions PSA results: 12/24:  6.3 3/25: 7.0 6/25:    6.3  He was seen in March 2025.  No new lower urinary tract symptoms.  He continued with nocturia x 1.  No dysuria or gross hematuria. IPSS = 3/1.  At his visit in June 2025, his lower urinary tract symptoms remained stable.  He had some frequency and nocturia x 1.  No dysuria or gross hematuria.  His weight was stable.  No bone pain. IPSS = 5/1.  He returns today for follow-up.  His lower urinary tract symptoms are stable.  He does have frequency and intermittency.  No dysuria or gross hematuria. IPSS = 7/3. He does have a history of erectile dysfunction.  He previously tried sildenafil  but experienced a headache.  He has tried tadalafil  in the past with good results.  Portions of the above documentation were copied from a prior visit for review purposes only.  Allergies: Allergies  Allergen Reactions   Hydrocodone-Acetaminophen  Nausea And Vomiting    PMH: Past Medical History:  Diagnosis Date   Allergy    Anxiety 02/13/2014   ED (erectile dysfunction)    GERD (gastroesophageal reflux disease)    Heart murmur 1999   ECHO NI 2005-dr paz had done   Hyperlipidemia    Hypertension    Past hx    Kidney  stones 2000    PSH: Past Surgical History:  Procedure Laterality Date   BUNIONECTOMY  1991   right   DENTAL SURGERY     SHOULDER ARTHROSCOPY WITH SUBACROMIAL DECOMPRESSION AND BICEP TENDON REPAIR Right 03/25/2013   Procedure: RIGHT SHOULDER ARTHROSCOPY WITH SUBACROMIAL DECOMPRESSION DISTAL CLAVICLE RESECTON LONG HEAD BICEP  TENDODESIS ;  Surgeon: Eva Elsie Herring, MD;  Location: [facility];  Service: Orthopedics;  Laterality: Right;    SH: Social History   Tobacco Use   Smoking status: Every Day    Current packs/day: 0.50    Types: Cigarettes, Pipe   Smokeless tobacco: Never   Tobacco comments:    Quit tobacco 09-2022. Smoking again, 2-3 cigarette every day .    Smoke 1/2 ppd from age 62, now 74: 17 pack a year   Substance Use Topics   Alcohol use: Not Currently    Alcohol/week: 5.0 standard drinks of alcohol    Types: 5 Shots of liquor per week    Comment: beer on occasion   Drug use: No    ROS: Constitutional:  Negative for fever, chills, weight loss CV: Negative for chest pain, previous MI, hypertension Respiratory:  Negative for shortness of breath, wheezing, sleep apnea, frequent cough GI:  Negative for nausea, vomiting, bloody stool, GERD  PE: BP (!) 159/92   Pulse 93   Ht 5' 11 (1.803 m)  Wt 200 lb (90.7 kg)   BMI 27.89 kg/m  GENERAL APPEARANCE:  Well appearing, well developed, well nourished, NAD HEENT:  Atraumatic, normocephalic, oropharynx clear NECK:  Supple without lymphadenopathy or thyromegaly ABDOMEN:  Soft, non-tender, no masses EXTREMITIES:  Moves all extremities well, without clubbing, cyanosis, or edema NEUROLOGIC:  Alert and oriented x 3, normal gait, CN II-XII grossly intact MENTAL STATUS:  appropriate BACK:  Non-tender to palpation, No CVAT SKIN:  Warm, dry, and intact    Results: U/A: 0-5 WBCs, 0-2 RBCs

## 2024-07-17 LAB — PSA: Prostate Specific Ag, Serum: 6.5 ng/mL — ABNORMAL HIGH (ref 0.0–4.0)

## 2024-07-18 ENCOUNTER — Ambulatory Visit: Payer: Self-pay | Admitting: Urology

## 2024-08-20 ENCOUNTER — Other Ambulatory Visit (HOSPITAL_BASED_OUTPATIENT_CLINIC_OR_DEPARTMENT_OTHER): Payer: Self-pay | Admitting: Urology

## 2024-08-20 ENCOUNTER — Ambulatory Visit (INDEPENDENT_AMBULATORY_CARE_PROVIDER_SITE_OTHER): Admitting: Urology

## 2024-08-20 ENCOUNTER — Encounter: Payer: Self-pay | Admitting: Urology

## 2024-08-20 ENCOUNTER — Ambulatory Visit (HOSPITAL_BASED_OUTPATIENT_CLINIC_OR_DEPARTMENT_OTHER)
Admission: RE | Admit: 2024-08-20 | Discharge: 2024-08-20 | Disposition: A | Discharge status: Home / Self Care | Source: Ambulatory Visit | Attending: Urology | Admitting: Urology

## 2024-08-20 VITALS — BP 158/71 | HR 97 | Ht 71.0 in | Wt 200.0 lb

## 2024-08-20 DIAGNOSIS — C61 Malignant neoplasm of prostate: Secondary | ICD-10-CM

## 2024-08-20 DIAGNOSIS — Z2989 Encounter for other specified prophylactic measures: Secondary | ICD-10-CM | POA: Diagnosis not present

## 2024-08-20 DIAGNOSIS — N529 Male erectile dysfunction, unspecified: Secondary | ICD-10-CM

## 2024-08-20 DIAGNOSIS — N4232 Atypical small acinar proliferation of prostate: Secondary | ICD-10-CM

## 2024-08-20 LAB — URINALYSIS, ROUTINE W REFLEX MICROSCOPIC
Bilirubin, UA: NEGATIVE
Glucose, UA: NEGATIVE
Ketones, UA: NEGATIVE
Leukocytes,UA: NEGATIVE
Nitrite, UA: NEGATIVE
Specific Gravity, UA: 1.025 (ref 1.005–1.030)
Urobilinogen, Ur: 0.2 mg/dL (ref 0.2–1.0)
pH, UA: 6 (ref 5.0–7.5)

## 2024-08-20 LAB — MICROSCOPIC EXAMINATION

## 2024-08-20 MED ORDER — CEFTRIAXONE SODIUM 500 MG IJ SOLR
1.0000 g | Freq: Once | INTRAMUSCULAR | Status: AC
  Administered 2024-08-20: 1 g via INTRAMUSCULAR

## 2024-08-20 NOTE — Progress Notes (Signed)
 IM Injection  Patient is present today for an IM Injection for treatment of infection prevention post prostate biopsy Drug: Ceftriaxone  Dose:1g Location: left outter buttock Lot: [id number] Exp:04/2026 Patient tolerated well, no complications were noted  Performed by: C. Koleen, LPN

## 2024-08-20 NOTE — Progress Notes (Signed)
 Assessment: 1. Malignant neoplasm of prostate (HCC); low risk; GG 1; dx'd 9/24; on active surveillance   2. Organic impotence     Plan: Postbiopsy instructions given. Return to office in 7-10 days for biopsy results.  Chief Complaint: Prostate cancer  HPI: Keith West is a 53 y.o. male who presents for continued evaluation of low risk prostate cancer.       Prostate cancer summary: Initial diagnosis TRUS/BX 07/2023 Path: 3/12 cores positive; grade group 1 (Gleason score 3+3 = 6) max core involvement = 44%. TRUS volume = 32 mL; MRI volume = 47 mL PSA 05/2023= 4.88 Clinical stage T1c/nl DRE   DECIPHER GENOMIC RISK GROUP= LOW (0.32)  Multiparametric prostate MRI 09/2023--no focal lesions PSA results: 12/24:  6.3 3/25: 7.0 6/25:    6.3 9/25:   6.5  He was seen in March 2025.  No new lower urinary tract symptoms.  He continued with nocturia x 1.  No dysuria or gross hematuria. IPSS = 3/1.  At his visit in June 2025, his lower urinary tract symptoms remained stable.  He had some frequency and nocturia x 1.  No dysuria or gross hematuria.  His weight was stable.  No bone pain. IPSS = 5/1.  At his visit in 9/25, his ower urinary tract symptoms remained stable.  He continued with frequency and intermittency.  No dysuria or gross hematuria. IPSS = 7/3. He has a history of erectile dysfunction.  He previously tried sildenafil  but experienced a headache.  He has tried tadalafil  in the past with good results.  He presents today for confirmatory prostate biopsy for continued management of his low risk prostate cancer.  Portions of the above documentation were copied from a prior visit for review purposes only.  Allergies: Allergies  Allergen Reactions   Hydrocodone-Acetaminophen  Nausea And Vomiting    PMH: Past Medical History:  Diagnosis Date   Allergy    Anxiety 02/13/2014   ED (erectile dysfunction)    GERD (gastroesophageal reflux disease)    Heart murmur 1999    ECHO NI 2005-dr paz had done   Hyperlipidemia    Hypertension    Past hx    Kidney stones 2000    PSH: Past Surgical History:  Procedure Laterality Date   BUNIONECTOMY  1991   right   DENTAL SURGERY     SHOULDER ARTHROSCOPY WITH SUBACROMIAL DECOMPRESSION AND BICEP TENDON REPAIR Right 03/25/2013   Procedure: RIGHT SHOULDER ARTHROSCOPY WITH SUBACROMIAL DECOMPRESSION DISTAL CLAVICLE RESECTON LONG HEAD BICEP  TENDODESIS ;  Surgeon: Eva Elsie Herring, MD;  Location:  [facility];  Service: Orthopedics;  Laterality: Right;    SH: Social History   Tobacco Use   Smoking status: Every Day    Current packs/day: 0.50    Types: Cigarettes, Pipe   Smokeless tobacco: Never   Tobacco comments:    Quit tobacco 09-2022. Smoking again, 2-3 cigarette every day .    Smoke 1/2 ppd from age 30, now 26: 17 pack a year   Substance Use Topics   Alcohol use: Not Currently    Alcohol/week: 5.0 standard drinks of alcohol    Types: 5 Shots of liquor per week    Comment: beer on occasion   Drug use: No    ROS: Constitutional:  Negative for fever, chills, weight loss CV: Negative for chest pain, previous MI, hypertension Respiratory:  Negative for shortness of breath, wheezing, sleep apnea, frequent cough GI:  Negative for nausea, vomiting, bloody stool, GERD  PE: BP (!) 158/71   Pulse 97   Ht 5' 11 (1.803 m)   Wt 200 lb (90.7 kg)   BMI 27.89 kg/m  GENERAL APPEARANCE:  Well appearing, well developed, well nourished, NAD   Results: U/A: 0-5 WBC, 0-2 RBC  TRANSRECTAL ULTRASOUND AND PROSTATE BIOPSY  Indication:  Prostate cancer  Prophylactic antibiotic administration: Rocephin   All medications that could result in increased bleeding were discontinued within an appropriate period of the time of biopsy.  Risk including bleeding and infection were discussed.  Informed consent was obtained.  The patient was placed in the left lateral decubitus position.  PROCEDURE 1.   TRANSRECTAL ULTRASOUND OF THE PROSTATE  The 7 MHz transrectal probe was used to image the prostate.  Anal stenosis was not noted.  TRUS volume: 47.2 ml  Hypoechoic areas: None  Hyperechoic areas: None  Central calcifications: not present  Margins:  normal  Seminal Vesicles: normal   PROCEDURE 2:  PROSTATE BIOPSY  A periprostatic block was performed using 1% lidocaine  and transrectal ultrasound guidance. Under transrectal ultrasound guidance, and using the Biopty gun, prostate biopsies were obtained systematically from the apex, mid gland, and base bilaterally.  A total of 12 cores were obtained.  Hemostasis was obtained with gentle pressure on the prostate.  The procedures were well-tolerated.  No significant bleeding was noted at the end of the procedure.  The patient was stable for discharge from the office.

## 2024-08-22 ENCOUNTER — Encounter: Payer: Self-pay | Admitting: Urology

## 2024-08-22 LAB — PROSTATE CORE NEEDLE BIOPSY

## 2024-08-28 ENCOUNTER — Ambulatory Visit: Payer: Self-pay | Admitting: Urology

## 2024-08-30 ENCOUNTER — Ambulatory Visit: Admitting: Urology

## 2024-10-09 ENCOUNTER — Other Ambulatory Visit: Payer: Self-pay | Admitting: Internal Medicine

## 2025-02-12 ENCOUNTER — Encounter: Admitting: Internal Medicine
